# Patient Record
Sex: Female | Born: 1950
Health system: Southern US, Community
[De-identification: ages and names within clinical notes are randomized; demographics above are authoritative.]

## PROBLEM LIST (undated history)

## (undated) DIAGNOSIS — Z923 Personal history of irradiation: Secondary | ICD-10-CM

## (undated) DIAGNOSIS — F32A Depression, unspecified: Secondary | ICD-10-CM

## (undated) DIAGNOSIS — E7800 Pure hypercholesterolemia, unspecified: Secondary | ICD-10-CM

## (undated) DIAGNOSIS — Z8042 Family history of malignant neoplasm of prostate: Secondary | ICD-10-CM

## (undated) DIAGNOSIS — Z803 Family history of malignant neoplasm of breast: Secondary | ICD-10-CM

## (undated) DIAGNOSIS — K219 Gastro-esophageal reflux disease without esophagitis: Secondary | ICD-10-CM

## (undated) DIAGNOSIS — C50919 Malignant neoplasm of unspecified site of unspecified female breast: Secondary | ICD-10-CM

## (undated) DIAGNOSIS — R7303 Prediabetes: Secondary | ICD-10-CM

## (undated) DIAGNOSIS — F419 Anxiety disorder, unspecified: Secondary | ICD-10-CM

## (undated) HISTORY — DX: Family history of malignant neoplasm of prostate: Z80.42

## (undated) HISTORY — PX: BREAST LUMPECTOMY: SHX2

## (undated) HISTORY — DX: Family history of malignant neoplasm of breast: Z80.3

---

## 1997-04-01 DIAGNOSIS — Z923 Personal history of irradiation: Secondary | ICD-10-CM

## 1997-04-01 HISTORY — DX: Personal history of irradiation: Z92.3

## 1997-04-01 HISTORY — PX: BREAST LUMPECTOMY: SHX2

## 1997-07-28 ENCOUNTER — Other Ambulatory Visit: Admission: RE | Admit: 1997-07-28 | Discharge: 1997-07-28 | Payer: Self-pay | Admitting: Obstetrics and Gynecology

## 1998-01-23 ENCOUNTER — Ambulatory Visit (HOSPITAL_COMMUNITY): Admission: RE | Admit: 1998-01-23 | Discharge: 1998-01-23 | Payer: Self-pay | Admitting: Obstetrics and Gynecology

## 1998-01-23 ENCOUNTER — Encounter: Payer: Self-pay | Admitting: Obstetrics and Gynecology

## 1998-01-24 ENCOUNTER — Encounter: Payer: Self-pay | Admitting: Obstetrics and Gynecology

## 1998-01-24 ENCOUNTER — Ambulatory Visit (HOSPITAL_COMMUNITY): Admission: RE | Admit: 1998-01-24 | Discharge: 1998-01-24 | Payer: Self-pay | Admitting: Obstetrics and Gynecology

## 1998-01-27 ENCOUNTER — Encounter: Admission: RE | Admit: 1998-01-27 | Discharge: 1998-04-27 | Payer: Self-pay | Admitting: *Deleted

## 1998-02-03 ENCOUNTER — Ambulatory Visit (HOSPITAL_BASED_OUTPATIENT_CLINIC_OR_DEPARTMENT_OTHER): Admission: RE | Admit: 1998-02-03 | Discharge: 1998-02-03 | Payer: Self-pay | Admitting: Surgery

## 1998-02-03 HISTORY — PX: BREAST EXCISIONAL BIOPSY: SUR124

## 1998-08-02 ENCOUNTER — Other Ambulatory Visit: Admission: RE | Admit: 1998-08-02 | Discharge: 1998-08-02 | Payer: Self-pay | Admitting: Obstetrics and Gynecology

## 1998-11-15 ENCOUNTER — Ambulatory Visit (HOSPITAL_COMMUNITY): Admission: RE | Admit: 1998-11-15 | Discharge: 1998-11-15 | Payer: Self-pay | Admitting: Obstetrics and Gynecology

## 1998-11-15 ENCOUNTER — Encounter: Payer: Self-pay | Admitting: Obstetrics and Gynecology

## 1999-05-16 ENCOUNTER — Encounter: Payer: Self-pay | Admitting: Surgery

## 1999-05-16 ENCOUNTER — Ambulatory Visit (HOSPITAL_COMMUNITY): Admission: RE | Admit: 1999-05-16 | Discharge: 1999-05-16 | Payer: Self-pay | Admitting: Surgery

## 1999-08-15 ENCOUNTER — Other Ambulatory Visit: Admission: RE | Admit: 1999-08-15 | Discharge: 1999-08-15 | Payer: Self-pay | Admitting: Obstetrics and Gynecology

## 1999-11-16 ENCOUNTER — Encounter: Admission: RE | Admit: 1999-11-16 | Discharge: 1999-11-16 | Payer: Self-pay | Admitting: Surgery

## 1999-11-16 ENCOUNTER — Encounter: Payer: Self-pay | Admitting: Surgery

## 2000-08-18 ENCOUNTER — Other Ambulatory Visit: Admission: RE | Admit: 2000-08-18 | Discharge: 2000-08-18 | Payer: Self-pay | Admitting: Obstetrics and Gynecology

## 2000-12-08 ENCOUNTER — Encounter: Admission: RE | Admit: 2000-12-08 | Discharge: 2000-12-08 | Payer: Self-pay | Admitting: Surgery

## 2000-12-08 ENCOUNTER — Encounter: Payer: Self-pay | Admitting: Surgery

## 2001-12-18 ENCOUNTER — Encounter: Admission: RE | Admit: 2001-12-18 | Discharge: 2001-12-18 | Payer: Self-pay | Admitting: Surgery

## 2001-12-18 ENCOUNTER — Encounter: Payer: Self-pay | Admitting: Surgery

## 2002-02-22 ENCOUNTER — Emergency Department (HOSPITAL_COMMUNITY): Admission: EM | Admit: 2002-02-22 | Discharge: 2002-02-23 | Payer: Self-pay | Admitting: Emergency Medicine

## 2002-04-01 HISTORY — PX: COLONOSCOPY: SHX174

## 2002-12-24 ENCOUNTER — Encounter: Payer: Self-pay | Admitting: Surgery

## 2002-12-24 ENCOUNTER — Encounter: Admission: RE | Admit: 2002-12-24 | Discharge: 2002-12-24 | Payer: Self-pay | Admitting: Surgery

## 2003-12-26 ENCOUNTER — Encounter: Admission: RE | Admit: 2003-12-26 | Discharge: 2003-12-26 | Payer: Self-pay | Admitting: Surgery

## 2005-03-01 ENCOUNTER — Encounter: Admission: RE | Admit: 2005-03-01 | Discharge: 2005-03-01 | Payer: Self-pay | Admitting: Surgery

## 2005-03-18 ENCOUNTER — Encounter: Admission: RE | Admit: 2005-03-18 | Discharge: 2005-03-18 | Payer: Self-pay | Admitting: Surgery

## 2005-07-31 ENCOUNTER — Encounter: Payer: Self-pay | Admitting: Surgery

## 2005-12-30 ENCOUNTER — Encounter: Admission: RE | Admit: 2005-12-30 | Discharge: 2005-12-30 | Payer: Self-pay | Admitting: Surgery

## 2007-02-23 ENCOUNTER — Encounter: Admission: RE | Admit: 2007-02-23 | Discharge: 2007-02-23 | Payer: Self-pay | Admitting: Surgery

## 2008-04-22 ENCOUNTER — Emergency Department (HOSPITAL_COMMUNITY): Admission: EM | Admit: 2008-04-22 | Discharge: 2008-04-22 | Payer: Self-pay | Admitting: Emergency Medicine

## 2008-05-01 ENCOUNTER — Emergency Department (HOSPITAL_COMMUNITY): Admission: EM | Admit: 2008-05-01 | Discharge: 2008-05-01 | Payer: Self-pay | Admitting: Family Medicine

## 2008-07-04 ENCOUNTER — Encounter: Admission: RE | Admit: 2008-07-04 | Discharge: 2008-07-04 | Payer: Self-pay | Admitting: Surgery

## 2010-01-22 ENCOUNTER — Encounter: Admission: RE | Admit: 2010-01-22 | Discharge: 2010-01-22 | Payer: Self-pay | Admitting: Surgery

## 2010-04-21 ENCOUNTER — Encounter: Payer: Self-pay | Admitting: Surgery

## 2011-01-13 ENCOUNTER — Encounter: Payer: Self-pay | Admitting: *Deleted

## 2011-01-13 ENCOUNTER — Emergency Department (HOSPITAL_BASED_OUTPATIENT_CLINIC_OR_DEPARTMENT_OTHER)
Admission: EM | Admit: 2011-01-13 | Discharge: 2011-01-13 | Disposition: A | Discharge status: Home / Self Care | Payer: BC Managed Care – PPO | Attending: Emergency Medicine | Admitting: Emergency Medicine

## 2011-01-13 ENCOUNTER — Emergency Department (INDEPENDENT_AMBULATORY_CARE_PROVIDER_SITE_OTHER): Payer: BC Managed Care – PPO

## 2011-01-13 DIAGNOSIS — E7800 Pure hypercholesterolemia, unspecified: Secondary | ICD-10-CM | POA: Insufficient documentation

## 2011-01-13 DIAGNOSIS — X500XXA Overexertion from strenuous movement or load, initial encounter: Secondary | ICD-10-CM

## 2011-01-13 DIAGNOSIS — IMO0002 Reserved for concepts with insufficient information to code with codable children: Secondary | ICD-10-CM

## 2011-01-13 DIAGNOSIS — Z853 Personal history of malignant neoplasm of breast: Secondary | ICD-10-CM | POA: Insufficient documentation

## 2011-01-13 DIAGNOSIS — S93609A Unspecified sprain of unspecified foot, initial encounter: Secondary | ICD-10-CM | POA: Insufficient documentation

## 2011-01-13 DIAGNOSIS — M79609 Pain in unspecified limb: Secondary | ICD-10-CM

## 2011-01-13 DIAGNOSIS — M7730 Calcaneal spur, unspecified foot: Secondary | ICD-10-CM

## 2011-01-13 DIAGNOSIS — W1809XA Striking against other object with subsequent fall, initial encounter: Secondary | ICD-10-CM | POA: Insufficient documentation

## 2011-01-13 HISTORY — DX: Anxiety disorder, unspecified: F41.9

## 2011-01-13 HISTORY — DX: Malignant neoplasm of unspecified site of unspecified female breast: C50.919

## 2011-01-13 HISTORY — DX: Pure hypercholesterolemia, unspecified: E78.00

## 2011-01-13 MED ORDER — HYDROCODONE-ACETAMINOPHEN 5-325 MG PO TABS: 1.0000 | ORAL_TABLET | Freq: Four times a day (QID) | ORAL | Status: AC | PRN

## 2011-01-13 NOTE — ED Notes (Signed)
Pt states she bent her toes back somehow and is now c/o pain to her left foot. Tried to lie down tonight and it was throbbing. Has tried ice without relief.

## 2011-01-13 NOTE — ED Provider Notes (Addendum)
History     CSN: [id number] Arrival date & time: 01/13/2011 12:41 AM  Chief Complaint  Patient presents with  . Foot Injury    (Consider location/radiation/quality/duration/timing/severity/associated sxs/prior treatment) HPI This is a 60 year old white female who tripped over a baby stroller yesterday afternoon about 5 PM. In doing so she hyperextended her left great toe. She is now having moderate pain and tenderness at the base of the left great toe. There is also pain across the plantar forefoot. Pain is exacerbated by ambulation or extension of the left great toe. She denies other injury. She took 800 mg of ibuprofen about 10 PM with some relief.  Past Medical History  Diagnosis Date  . Breast cancer   . Hypercholesteremia   . Anxiety     Past Surgical History  Procedure Date  . Breast lumpectomy     History reviewed. No pertinent family history.  History  Substance Use Topics  . Smoking status: Never Smoker   . Smokeless tobacco: Not on file  . Alcohol Use: Yes    OB History    Grav Para Term Preterm Abortions TAB SAB Ect Mult Living                  Review of Systems  All other systems reviewed and are negative.    Allergies  Sulfa antibiotics  Home Medications   Current Outpatient Rx  Name Route Sig Dispense Refill  . ESCITALOPRAM OXALATE 20 MG PO TABS Oral Take 20 mg by mouth daily.      Marland Kitchen SIMVASTATIN 40 MG PO TABS Oral Take 40 mg by mouth at bedtime.        BP 143/83  Pulse 86  Temp(Src) 98.3 F (36.8 C) (Oral)  Resp 20  Ht 5\' 8"  (1.727 m)  Wt 205 lb (92.987 kg)  BMI 31.17 kg/m2  SpO2 96%  Physical Exam General: Well-developed, well-nourished female in no acute distress; appearance consistent with age of record HENT: normocephalic, atraumatic Eyes: Normal appearance Neck: supple Heart: regular rate and rhythm Lungs: Normal respiratory effort and excursion Abdomen: soft; nondistended Extremities: pulses normal; no deformity;  tenderness at base of left great toe with pain on extension of the left great toe; bunions present bilaterally Neurologic: Awake, alert and oriented;motor function intact in all extremities and symmetric; no facial droop Skin: Warm and dry Psychiatric: Normal mood and affect    ED Course  Procedures (including critical care time)   MDM  Nursing notes and vitals signs, including pulse oximetry, reviewed.  Summary of this visit's results, reviewed by myself:  Labs:  No results found for this or any previous visit.  Imaging Studies: Dg Foot Complete Left  01/13/2011  *RADIOLOGY REPORT*  Clinical Data: Left foot pain after hyperextension injury.  LEFT FOOT - COMPLETE 3+ VIEW  Comparison: None.  Findings: Degenerative changes in the first metatarsal phalangeal joint.  Small plantar calcaneal spur.  Focal bone spur along the proximal second metatarsal.  No acute fracture or subluxations suggested.  IMPRESSION: Degenerative changes.  No acute bony abnormalities demonstrated.  Original Report Authenticated By: Marlon Pel, M.D.            Hanley Seamen, MD 01/13/11 0302  Hanley Seamen, MD 01/13/11 [phone]

## 2011-05-06 ENCOUNTER — Other Ambulatory Visit: Payer: Self-pay | Admitting: Internal Medicine

## 2011-05-06 DIAGNOSIS — Z1231 Encounter for screening mammogram for malignant neoplasm of breast: Secondary | ICD-10-CM

## 2011-05-20 ENCOUNTER — Ambulatory Visit
Admission: RE | Admit: 2011-05-20 | Discharge: 2011-05-20 | Disposition: A | Discharge status: Home / Self Care | Payer: BC Managed Care – PPO | Source: Ambulatory Visit | Attending: Internal Medicine | Admitting: Internal Medicine

## 2011-05-20 DIAGNOSIS — Z1231 Encounter for screening mammogram for malignant neoplasm of breast: Secondary | ICD-10-CM

## 2012-04-09 ENCOUNTER — Encounter: Payer: Self-pay | Admitting: Gastroenterology

## 2012-05-11 ENCOUNTER — Other Ambulatory Visit (HOSPITAL_COMMUNITY)
Admission: RE | Admit: 2012-05-11 | Discharge: 2012-05-11 | Disposition: A | Discharge status: Home / Self Care | Payer: BC Managed Care – PPO | Source: Ambulatory Visit | Attending: Internal Medicine | Admitting: Internal Medicine

## 2012-05-11 DIAGNOSIS — Z01419 Encounter for gynecological examination (general) (routine) without abnormal findings: Secondary | ICD-10-CM | POA: Insufficient documentation

## 2013-01-15 ENCOUNTER — Encounter: Payer: Self-pay | Admitting: Gastroenterology

## 2013-05-05 ENCOUNTER — Other Ambulatory Visit: Payer: Self-pay

## 2013-05-05 DIAGNOSIS — Z9889 Other specified postprocedural states: Secondary | ICD-10-CM

## 2013-05-05 DIAGNOSIS — Z1231 Encounter for screening mammogram for malignant neoplasm of breast: Secondary | ICD-10-CM

## 2013-05-26 ENCOUNTER — Ambulatory Visit
Admission: RE | Admit: 2013-05-26 | Discharge: 2013-05-26 | Disposition: A | Discharge status: Home / Self Care | Payer: Self-pay | Source: Ambulatory Visit

## 2013-05-26 DIAGNOSIS — Z1231 Encounter for screening mammogram for malignant neoplasm of breast: Secondary | ICD-10-CM

## 2013-05-26 DIAGNOSIS — Z9889 Other specified postprocedural states: Secondary | ICD-10-CM

## 2013-10-29 ENCOUNTER — Encounter: Payer: Self-pay | Admitting: Gastroenterology

## 2014-11-17 ENCOUNTER — Other Ambulatory Visit: Payer: Self-pay

## 2014-11-17 DIAGNOSIS — Z1231 Encounter for screening mammogram for malignant neoplasm of breast: Secondary | ICD-10-CM

## 2014-11-28 ENCOUNTER — Ambulatory Visit
Admission: RE | Admit: 2014-11-28 | Discharge: 2014-11-28 | Disposition: A | Discharge status: Home / Self Care | Payer: BLUE CROSS/BLUE SHIELD | Source: Ambulatory Visit

## 2014-11-28 DIAGNOSIS — Z1231 Encounter for screening mammogram for malignant neoplasm of breast: Secondary | ICD-10-CM

## 2015-07-03 DIAGNOSIS — R7303 Prediabetes: Secondary | ICD-10-CM | POA: Diagnosis not present

## 2015-07-03 DIAGNOSIS — E782 Mixed hyperlipidemia: Secondary | ICD-10-CM | POA: Diagnosis not present

## 2015-07-03 DIAGNOSIS — R030 Elevated blood-pressure reading, without diagnosis of hypertension: Secondary | ICD-10-CM | POA: Diagnosis not present

## 2015-07-03 DIAGNOSIS — Z0000 Encounter for general adult medical examination without abnormal findings: Secondary | ICD-10-CM | POA: Diagnosis not present

## 2015-07-24 DIAGNOSIS — H04123 Dry eye syndrome of bilateral lacrimal glands: Secondary | ICD-10-CM | POA: Diagnosis not present

## 2015-07-24 DIAGNOSIS — D3131 Benign neoplasm of right choroid: Secondary | ICD-10-CM | POA: Diagnosis not present

## 2015-08-07 ENCOUNTER — Other Ambulatory Visit (HOSPITAL_COMMUNITY)
Admission: RE | Admit: 2015-08-07 | Discharge: 2015-08-07 | Disposition: A | Discharge status: Home / Self Care | Payer: PPO | Source: Ambulatory Visit | Attending: Internal Medicine | Admitting: Internal Medicine

## 2015-08-07 ENCOUNTER — Other Ambulatory Visit: Payer: Self-pay | Admitting: Registered Nurse

## 2015-08-07 DIAGNOSIS — Z780 Asymptomatic menopausal state: Secondary | ICD-10-CM | POA: Diagnosis not present

## 2015-08-07 DIAGNOSIS — Z124 Encounter for screening for malignant neoplasm of cervix: Secondary | ICD-10-CM | POA: Insufficient documentation

## 2015-08-07 DIAGNOSIS — Z01419 Encounter for gynecological examination (general) (routine) without abnormal findings: Secondary | ICD-10-CM | POA: Diagnosis not present

## 2015-08-07 DIAGNOSIS — Z1212 Encounter for screening for malignant neoplasm of rectum: Secondary | ICD-10-CM | POA: Diagnosis not present

## 2015-08-07 DIAGNOSIS — Z853 Personal history of malignant neoplasm of breast: Secondary | ICD-10-CM | POA: Diagnosis not present

## 2015-08-10 LAB — CYTOLOGY - PAP

## 2015-12-06 DIAGNOSIS — Z808 Family history of malignant neoplasm of other organs or systems: Secondary | ICD-10-CM | POA: Diagnosis not present

## 2015-12-06 DIAGNOSIS — Z85828 Personal history of other malignant neoplasm of skin: Secondary | ICD-10-CM | POA: Diagnosis not present

## 2015-12-06 DIAGNOSIS — L570 Actinic keratosis: Secondary | ICD-10-CM | POA: Diagnosis not present

## 2015-12-06 DIAGNOSIS — L821 Other seborrheic keratosis: Secondary | ICD-10-CM | POA: Diagnosis not present

## 2016-04-26 DIAGNOSIS — A084 Viral intestinal infection, unspecified: Secondary | ICD-10-CM | POA: Diagnosis not present

## 2016-07-22 DIAGNOSIS — Z131 Encounter for screening for diabetes mellitus: Secondary | ICD-10-CM | POA: Diagnosis not present

## 2016-07-22 DIAGNOSIS — I479 Paroxysmal tachycardia, unspecified: Secondary | ICD-10-CM | POA: Diagnosis not present

## 2016-07-22 DIAGNOSIS — E782 Mixed hyperlipidemia: Secondary | ICD-10-CM | POA: Diagnosis not present

## 2016-07-22 DIAGNOSIS — R7303 Prediabetes: Secondary | ICD-10-CM | POA: Diagnosis not present

## 2016-07-29 ENCOUNTER — Encounter: Payer: Self-pay | Admitting: Gastroenterology

## 2016-07-29 ENCOUNTER — Other Ambulatory Visit: Payer: Self-pay | Admitting: Internal Medicine

## 2016-07-29 DIAGNOSIS — E782 Mixed hyperlipidemia: Secondary | ICD-10-CM | POA: Diagnosis not present

## 2016-07-29 DIAGNOSIS — Z0000 Encounter for general adult medical examination without abnormal findings: Secondary | ICD-10-CM | POA: Diagnosis not present

## 2016-07-29 DIAGNOSIS — R1033 Periumbilical pain: Secondary | ICD-10-CM | POA: Diagnosis not present

## 2016-07-29 DIAGNOSIS — Z780 Asymptomatic menopausal state: Secondary | ICD-10-CM | POA: Diagnosis not present

## 2016-07-29 DIAGNOSIS — Z1231 Encounter for screening mammogram for malignant neoplasm of breast: Secondary | ICD-10-CM

## 2016-07-29 DIAGNOSIS — I479 Paroxysmal tachycardia, unspecified: Secondary | ICD-10-CM | POA: Diagnosis not present

## 2016-07-29 DIAGNOSIS — R030 Elevated blood-pressure reading, without diagnosis of hypertension: Secondary | ICD-10-CM | POA: Diagnosis not present

## 2016-07-29 DIAGNOSIS — R7303 Prediabetes: Secondary | ICD-10-CM | POA: Diagnosis not present

## 2016-07-30 DIAGNOSIS — H04123 Dry eye syndrome of bilateral lacrimal glands: Secondary | ICD-10-CM | POA: Diagnosis not present

## 2016-07-30 DIAGNOSIS — D3131 Benign neoplasm of right choroid: Secondary | ICD-10-CM | POA: Diagnosis not present

## 2016-08-12 DIAGNOSIS — Z01419 Encounter for gynecological examination (general) (routine) without abnormal findings: Secondary | ICD-10-CM | POA: Diagnosis not present

## 2016-08-20 ENCOUNTER — Ambulatory Visit: Payer: PPO

## 2016-09-04 ENCOUNTER — Ambulatory Visit
Admission: RE | Admit: 2016-09-04 | Discharge: 2016-09-04 | Disposition: A | Discharge status: Home / Self Care | Payer: PPO | Source: Ambulatory Visit | Attending: Internal Medicine | Admitting: Internal Medicine

## 2016-09-04 DIAGNOSIS — Z1231 Encounter for screening mammogram for malignant neoplasm of breast: Secondary | ICD-10-CM | POA: Diagnosis not present

## 2016-09-04 HISTORY — DX: Personal history of irradiation: Z92.3

## 2016-09-05 ENCOUNTER — Ambulatory Visit: Payer: PPO

## 2016-09-10 ENCOUNTER — Encounter: Payer: Self-pay | Admitting: Gastroenterology

## 2016-09-10 ENCOUNTER — Ambulatory Visit: Payer: PPO | Admitting: *Deleted

## 2016-09-10 VITALS — Ht 68.0 in | Wt 216.0 lb

## 2016-09-10 DIAGNOSIS — Z1211 Encounter for screening for malignant neoplasm of colon: Secondary | ICD-10-CM

## 2016-09-10 MED ORDER — NA SULFATE-K SULFATE-MG SULF 17.5-3.13-1.6 GM/177ML PO SOLN: 1.0000 | Freq: Once | ORAL | 0 refills | Status: AC

## 2016-09-10 NOTE — Progress Notes (Signed)
Denies allergies to eggs or soy products. Denies complications with sedation or anesthesia. Denies O2 use. Denies use of diet or weight loss medications.  Emmi instructions given for colonoscopy.  

## 2016-09-24 ENCOUNTER — Encounter: Payer: Self-pay | Admitting: Gastroenterology

## 2016-09-24 ENCOUNTER — Ambulatory Visit (AMBULATORY_SURGERY_CENTER): Payer: PPO | Admitting: Gastroenterology

## 2016-09-24 VITALS — BP 131/64 | HR 72 | Temp 98.4°F | Resp 14 | Ht 68.0 in | Wt 216.0 lb

## 2016-09-24 DIAGNOSIS — Z1212 Encounter for screening for malignant neoplasm of rectum: Secondary | ICD-10-CM | POA: Diagnosis not present

## 2016-09-24 DIAGNOSIS — Z1211 Encounter for screening for malignant neoplasm of colon: Secondary | ICD-10-CM | POA: Diagnosis not present

## 2016-09-24 DIAGNOSIS — D123 Benign neoplasm of transverse colon: Secondary | ICD-10-CM

## 2016-09-24 DIAGNOSIS — K635 Polyp of colon: Secondary | ICD-10-CM

## 2016-09-24 MED ORDER — SODIUM CHLORIDE 0.9 % IV SOLN: 500.0000 mL | INTRAVENOUS | Status: DC

## 2016-09-24 NOTE — Patient Instructions (Signed)
YOU HAD AN ENDOSCOPIC PROCEDURE TODAY AT [facility]:   Refer to the procedure report that was given to you for any specific questions about what was found during the examination.  If the procedure report does not answer your questions, please call your gastroenterologist to clarify.  If you requested that your care partner not be given the details of your procedure findings, then the procedure report has been included in a sealed envelope for you to review at your convenience later.  YOU SHOULD EXPECT: Some feelings of bloating in the abdomen. Passage of more gas than usual.  Walking can help get rid of the air that was put into your GI tract during the procedure and reduce the bloating. If you had a lower endoscopy (such as a colonoscopy or flexible sigmoidoscopy) you may notice spotting of blood in your stool or on the toilet paper. If you underwent a bowel prep for your procedure, you may not have a normal bowel movement for a few days.  Please Note:  You might notice some irritation and congestion in your nose or some drainage.  This is from the oxygen used during your procedure.  There is no need for concern and it should clear up in a day or so.  SYMPTOMS TO REPORT IMMEDIATELY:   Following lower endoscopy (colonoscopy or flexible sigmoidoscopy):  Excessive amounts of blood in the stool  Significant tenderness or worsening of abdominal pains  Swelling of the abdomen that is new, acute  Fever of 100F or higher    For urgent or emergent issues, a gastroenterologist can be reached at any hour by calling [phone].   DIET:  We do recommend a small meal at first, but then you may proceed to your regular diet.  Drink plenty of fluids but you should avoid alcoholic beverages for 24 hours.  ACTIVITY:  You should plan to take it easy for the rest of today and you should NOT DRIVE or use heavy machinery until tomorrow (because of the sedation medicines used during the test).     FOLLOW UP: Our staff will call the number listed on your records the next business day following your procedure to check on you and address any questions or concerns that you may have regarding the information given to you following your procedure. If we do not reach you, we will leave a message.  However, if you are feeling well and you are not experiencing any problems, there is no need to return our call.  We will assume that you have returned to your regular daily activities without incident.  If any biopsies were taken you will be contacted by phone or by letter within the next 1-3 weeks.  Please call us at [phone] if you have not heard about the biopsies in 3 weeks.    SIGNATURES/CONFIDENTIALITY: You and/or your care partner have signed paperwork which will be entered into your electronic medical record.  These signatures attest to the fact that that the information above on your After Visit Summary has been reviewed and is understood.  Full responsibility of the confidentiality of this discharge information lies with you and/or your care-partner.    INFORMATION ON POLYPS,DIVERTICULOSIS,& HEMORRHOIDS GIVEN TO YOU TODAY  AWAIT PATHOLOGY RESULTS

## 2016-09-24 NOTE — Op Note (Signed)
[facility] Patient Name: Gina Walsh Procedure Date: 09/24/2016 10:40 AM MRN: [id number] Endoscopist: Mauri Pole , MD Age: 66 Referring MD:  Date of Birth: Nov 22, 1950 Gender: Female Account #: [id number] Procedure:                Colonoscopy Indications:              Screening for colorectal malignant neoplasm Medicines:                Monitored Anesthesia Care Procedure:                Pre-Anesthesia Assessment:                           - Prior to the procedure, a History and Physical                            was performed, and patient medications and                            allergies were reviewed. The patient's tolerance of                            previous anesthesia was also reviewed. The risks                            and benefits of the procedure and the sedation                            options and risks were discussed with the patient.                            All questions were answered, and informed consent                            was obtained. Prior Anticoagulants: The patient has                            taken no previous anticoagulant or antiplatelet                            agents. ASA Grade Assessment: II - A patient with                            mild systemic disease. After reviewing the risks                            and benefits, the patient was deemed in                            satisfactory condition to undergo the procedure.                           After obtaining informed consent, the colonoscope  was passed under direct vision. Throughout the                            procedure, the patient's blood pressure, pulse, and                            oxygen saturations were monitored continuously. The                            Model PCF-[id number]) scope was introduced                            through the anus and advanced to the the terminal                            ileum,  with identification of the appendiceal                            orifice and IC valve. The colonoscopy was performed                            without difficulty. The patient tolerated the                            procedure well. The quality of the bowel                            preparation was excellent. The terminal ileum,                            ileocecal valve, appendiceal orifice, and rectum                            were photographed. Scope In: 10:43:57 AM Scope Out: 11:00:36 AM Scope Withdrawal Time: 0 hours 8 minutes 21 seconds  Total Procedure Duration: 0 hours 16 minutes 39 seconds  Findings:                 The perianal and digital rectal examinations were                            normal.                           A 3 mm polyp was found in the transverse colon. The                            polyp was sessile. The polyp was removed with a                            cold biopsy forceps. Resection and retrieval were                            complete.  Multiple small and large-mouthed diverticula were                            found in the sigmoid colon and descending colon.                            There was narrowing of the colon in association                            with the diverticular opening. There was evidence                            of diverticular spasm. Peri-diverticular erythema                            was seen.                           Non-bleeding internal hemorrhoids were found during                            retroflexion. The hemorrhoids were small.                           The exam was otherwise without abnormality. Complications:            No immediate complications. Estimated Blood Loss:     Estimated blood loss was minimal. Impression:               - One 3 mm polyp in the transverse colon, removed                            with a cold biopsy forceps. Resected and retrieved.                           -  Severe diverticulosis in the sigmoid colon and in                            the descending colon. There was narrowing of the                            colon in association with the diverticular opening.                            There was evidence of diverticular spasm.                            Peri-diverticular erythema was seen.                           - Non-bleeding internal hemorrhoids.                           - The examination was otherwise normal. Recommendation:           - Patient has a  contact number available for                            emergencies. The signs and symptoms of potential                            delayed complications were discussed with the                            patient. Return to normal activities tomorrow.                            Written discharge instructions were provided to the                            patient.                           - Resume previous diet.                           - Continue present medications.                           - Await pathology results.                           - Repeat colonoscopy in 5-10 years for surveillance                            based on pathology results. Mauri Pole, MD 09/24/2016 11:04:35 AM This report has been signed electronically.

## 2016-09-24 NOTE — Progress Notes (Signed)
Called to room to assist during endoscopic procedure.  Patient ID and intended procedure confirmed with present staff. Received instructions for my participation in the procedure from the performing physician.  

## 2016-09-24 NOTE — Progress Notes (Signed)
Spontaneous respirations throughout. VSS. Small amount of clear emesis mid-procedure without furthur event -- oropharynx suctioned. Dr Silverio Decamp aware. Resting comfortably. To PACU on room air. Report to  [facility]

## 2016-09-25 ENCOUNTER — Telehealth: Payer: Self-pay

## 2016-09-25 NOTE — Telephone Encounter (Signed)
  Follow up Call-  Call back number 09/24/2016  Post procedure Call Back phone  # [phone]  Permission to leave phone message Yes  Some recent data might be hidden     Patient questions:  Do you have a fever, pain , or abdominal swelling? No. Pain Score  0 *  Have you tolerated food without any problems? Yes.    Have you been able to return to your normal activities? Yes.    Do you have any questions about your discharge instructions: Diet   No. Medications  No. Follow up visit  No.  Do you have questions or concerns about your Care? No.  Actions: * If pain score is 4 or above: No action needed, pain <4.

## 2016-10-08 ENCOUNTER — Encounter: Payer: Self-pay | Admitting: Gastroenterology

## 2016-12-11 DIAGNOSIS — L570 Actinic keratosis: Secondary | ICD-10-CM | POA: Diagnosis not present

## 2016-12-11 DIAGNOSIS — Z808 Family history of malignant neoplasm of other organs or systems: Secondary | ICD-10-CM | POA: Diagnosis not present

## 2016-12-11 DIAGNOSIS — L821 Other seborrheic keratosis: Secondary | ICD-10-CM | POA: Diagnosis not present

## 2016-12-11 DIAGNOSIS — Z23 Encounter for immunization: Secondary | ICD-10-CM | POA: Diagnosis not present

## 2016-12-11 DIAGNOSIS — Z85828 Personal history of other malignant neoplasm of skin: Secondary | ICD-10-CM | POA: Diagnosis not present

## 2016-12-11 DIAGNOSIS — B079 Viral wart, unspecified: Secondary | ICD-10-CM | POA: Diagnosis not present

## 2016-12-11 DIAGNOSIS — D2372 Other benign neoplasm of skin of left lower limb, including hip: Secondary | ICD-10-CM | POA: Diagnosis not present

## 2017-05-13 DIAGNOSIS — Z411 Encounter for cosmetic surgery: Secondary | ICD-10-CM | POA: Diagnosis not present

## 2017-05-13 DIAGNOSIS — T451X5D Adverse effect of antineoplastic and immunosuppressive drugs, subsequent encounter: Secondary | ICD-10-CM | POA: Diagnosis not present

## 2017-07-15 DIAGNOSIS — T451X5D Adverse effect of antineoplastic and immunosuppressive drugs, subsequent encounter: Secondary | ICD-10-CM | POA: Diagnosis not present

## 2017-07-15 DIAGNOSIS — L570 Actinic keratosis: Secondary | ICD-10-CM | POA: Diagnosis not present

## 2017-08-04 DIAGNOSIS — E782 Mixed hyperlipidemia: Secondary | ICD-10-CM | POA: Diagnosis not present

## 2017-08-04 DIAGNOSIS — I479 Paroxysmal tachycardia, unspecified: Secondary | ICD-10-CM | POA: Diagnosis not present

## 2017-08-04 DIAGNOSIS — R7303 Prediabetes: Secondary | ICD-10-CM | POA: Diagnosis not present

## 2017-08-11 DIAGNOSIS — Z0000 Encounter for general adult medical examination without abnormal findings: Secondary | ICD-10-CM | POA: Diagnosis not present

## 2017-08-11 DIAGNOSIS — R7303 Prediabetes: Secondary | ICD-10-CM | POA: Diagnosis not present

## 2017-08-11 DIAGNOSIS — R030 Elevated blood-pressure reading, without diagnosis of hypertension: Secondary | ICD-10-CM | POA: Diagnosis not present

## 2017-08-11 DIAGNOSIS — G4700 Insomnia, unspecified: Secondary | ICD-10-CM | POA: Diagnosis not present

## 2017-08-11 DIAGNOSIS — E782 Mixed hyperlipidemia: Secondary | ICD-10-CM | POA: Diagnosis not present

## 2017-08-11 DIAGNOSIS — Z23 Encounter for immunization: Secondary | ICD-10-CM | POA: Diagnosis not present

## 2017-08-11 DIAGNOSIS — E6609 Other obesity due to excess calories: Secondary | ICD-10-CM | POA: Diagnosis not present

## 2017-08-11 DIAGNOSIS — I479 Paroxysmal tachycardia, unspecified: Secondary | ICD-10-CM | POA: Diagnosis not present

## 2017-08-18 DIAGNOSIS — Z01419 Encounter for gynecological examination (general) (routine) without abnormal findings: Secondary | ICD-10-CM | POA: Diagnosis not present

## 2017-09-02 DIAGNOSIS — H40013 Open angle with borderline findings, low risk, bilateral: Secondary | ICD-10-CM | POA: Diagnosis not present

## 2017-09-02 DIAGNOSIS — H25813 Combined forms of age-related cataract, bilateral: Secondary | ICD-10-CM | POA: Diagnosis not present

## 2017-09-02 DIAGNOSIS — D3131 Benign neoplasm of right choroid: Secondary | ICD-10-CM | POA: Diagnosis not present

## 2017-09-02 DIAGNOSIS — H43813 Vitreous degeneration, bilateral: Secondary | ICD-10-CM | POA: Diagnosis not present

## 2017-12-15 ENCOUNTER — Other Ambulatory Visit: Payer: Self-pay | Admitting: Internal Medicine

## 2017-12-15 DIAGNOSIS — Z1231 Encounter for screening mammogram for malignant neoplasm of breast: Secondary | ICD-10-CM

## 2018-01-19 ENCOUNTER — Ambulatory Visit: Payer: PPO

## 2018-02-04 ENCOUNTER — Ambulatory Visit
Admission: RE | Admit: 2018-02-04 | Discharge: 2018-02-04 | Disposition: A | Discharge status: Home / Self Care | Payer: PPO | Source: Ambulatory Visit | Attending: Internal Medicine | Admitting: Internal Medicine

## 2018-02-04 DIAGNOSIS — Z23 Encounter for immunization: Secondary | ICD-10-CM | POA: Diagnosis not present

## 2018-02-04 DIAGNOSIS — L814 Other melanin hyperpigmentation: Secondary | ICD-10-CM | POA: Diagnosis not present

## 2018-02-04 DIAGNOSIS — D2372 Other benign neoplasm of skin of left lower limb, including hip: Secondary | ICD-10-CM | POA: Diagnosis not present

## 2018-02-04 DIAGNOSIS — L821 Other seborrheic keratosis: Secondary | ICD-10-CM | POA: Diagnosis not present

## 2018-02-04 DIAGNOSIS — Z85828 Personal history of other malignant neoplasm of skin: Secondary | ICD-10-CM | POA: Diagnosis not present

## 2018-02-04 DIAGNOSIS — Z1231 Encounter for screening mammogram for malignant neoplasm of breast: Secondary | ICD-10-CM | POA: Diagnosis not present

## 2018-02-04 DIAGNOSIS — Z808 Family history of malignant neoplasm of other organs or systems: Secondary | ICD-10-CM | POA: Diagnosis not present

## 2018-09-07 DIAGNOSIS — D3131 Benign neoplasm of right choroid: Secondary | ICD-10-CM | POA: Diagnosis not present

## 2018-09-07 DIAGNOSIS — H2513 Age-related nuclear cataract, bilateral: Secondary | ICD-10-CM | POA: Diagnosis not present

## 2018-09-07 DIAGNOSIS — H40013 Open angle with borderline findings, low risk, bilateral: Secondary | ICD-10-CM | POA: Diagnosis not present

## 2018-09-07 DIAGNOSIS — H43813 Vitreous degeneration, bilateral: Secondary | ICD-10-CM | POA: Diagnosis not present

## 2018-09-23 DIAGNOSIS — I479 Paroxysmal tachycardia, unspecified: Secondary | ICD-10-CM | POA: Diagnosis not present

## 2018-09-23 DIAGNOSIS — R030 Elevated blood-pressure reading, without diagnosis of hypertension: Secondary | ICD-10-CM | POA: Diagnosis not present

## 2018-09-23 DIAGNOSIS — E782 Mixed hyperlipidemia: Secondary | ICD-10-CM | POA: Diagnosis not present

## 2018-10-01 DIAGNOSIS — E6609 Other obesity due to excess calories: Secondary | ICD-10-CM | POA: Diagnosis not present

## 2018-10-01 DIAGNOSIS — E782 Mixed hyperlipidemia: Secondary | ICD-10-CM | POA: Diagnosis not present

## 2018-10-01 DIAGNOSIS — Z0000 Encounter for general adult medical examination without abnormal findings: Secondary | ICD-10-CM | POA: Diagnosis not present

## 2018-10-01 DIAGNOSIS — R030 Elevated blood-pressure reading, without diagnosis of hypertension: Secondary | ICD-10-CM | POA: Diagnosis not present

## 2018-10-01 DIAGNOSIS — R7303 Prediabetes: Secondary | ICD-10-CM | POA: Diagnosis not present

## 2018-10-01 DIAGNOSIS — I479 Paroxysmal tachycardia, unspecified: Secondary | ICD-10-CM | POA: Diagnosis not present

## 2018-10-01 DIAGNOSIS — Z7189 Other specified counseling: Secondary | ICD-10-CM | POA: Diagnosis not present

## 2018-10-30 ENCOUNTER — Other Ambulatory Visit: Payer: Self-pay

## 2019-05-03 ENCOUNTER — Ambulatory Visit: Payer: PPO

## 2019-05-04 DIAGNOSIS — L814 Other melanin hyperpigmentation: Secondary | ICD-10-CM | POA: Diagnosis not present

## 2019-05-04 DIAGNOSIS — Z85828 Personal history of other malignant neoplasm of skin: Secondary | ICD-10-CM | POA: Diagnosis not present

## 2019-05-04 DIAGNOSIS — L578 Other skin changes due to chronic exposure to nonionizing radiation: Secondary | ICD-10-CM | POA: Diagnosis not present

## 2019-05-04 DIAGNOSIS — Z23 Encounter for immunization: Secondary | ICD-10-CM | POA: Diagnosis not present

## 2019-05-04 DIAGNOSIS — Z808 Family history of malignant neoplasm of other organs or systems: Secondary | ICD-10-CM | POA: Diagnosis not present

## 2019-05-04 DIAGNOSIS — D2372 Other benign neoplasm of skin of left lower limb, including hip: Secondary | ICD-10-CM | POA: Diagnosis not present

## 2019-05-04 DIAGNOSIS — L821 Other seborrheic keratosis: Secondary | ICD-10-CM | POA: Diagnosis not present

## 2019-05-04 DIAGNOSIS — Z411 Encounter for cosmetic surgery: Secondary | ICD-10-CM | POA: Diagnosis not present

## 2019-05-04 DIAGNOSIS — L570 Actinic keratosis: Secondary | ICD-10-CM | POA: Diagnosis not present

## 2019-05-09 ENCOUNTER — Ambulatory Visit: Payer: PPO

## 2019-09-13 DIAGNOSIS — H40013 Open angle with borderline findings, low risk, bilateral: Secondary | ICD-10-CM | POA: Diagnosis not present

## 2019-09-13 DIAGNOSIS — D3131 Benign neoplasm of right choroid: Secondary | ICD-10-CM | POA: Diagnosis not present

## 2019-09-13 DIAGNOSIS — H43813 Vitreous degeneration, bilateral: Secondary | ICD-10-CM | POA: Diagnosis not present

## 2019-09-13 DIAGNOSIS — H2513 Age-related nuclear cataract, bilateral: Secondary | ICD-10-CM | POA: Diagnosis not present

## 2019-11-04 ENCOUNTER — Other Ambulatory Visit: Payer: Self-pay | Admitting: Internal Medicine

## 2019-11-04 DIAGNOSIS — Z1231 Encounter for screening mammogram for malignant neoplasm of breast: Secondary | ICD-10-CM

## 2019-11-08 DIAGNOSIS — Z0000 Encounter for general adult medical examination without abnormal findings: Secondary | ICD-10-CM | POA: Diagnosis not present

## 2019-11-08 DIAGNOSIS — E782 Mixed hyperlipidemia: Secondary | ICD-10-CM | POA: Diagnosis not present

## 2019-11-08 DIAGNOSIS — R030 Elevated blood-pressure reading, without diagnosis of hypertension: Secondary | ICD-10-CM | POA: Diagnosis not present

## 2019-11-15 DIAGNOSIS — E6609 Other obesity due to excess calories: Secondary | ICD-10-CM | POA: Diagnosis not present

## 2019-11-15 DIAGNOSIS — Z0000 Encounter for general adult medical examination without abnormal findings: Secondary | ICD-10-CM | POA: Diagnosis not present

## 2019-11-15 DIAGNOSIS — R7303 Prediabetes: Secondary | ICD-10-CM | POA: Diagnosis not present

## 2019-11-15 DIAGNOSIS — G4700 Insomnia, unspecified: Secondary | ICD-10-CM | POA: Diagnosis not present

## 2019-11-15 DIAGNOSIS — E782 Mixed hyperlipidemia: Secondary | ICD-10-CM | POA: Diagnosis not present

## 2019-11-15 DIAGNOSIS — I479 Paroxysmal tachycardia, unspecified: Secondary | ICD-10-CM | POA: Diagnosis not present

## 2019-11-15 DIAGNOSIS — R030 Elevated blood-pressure reading, without diagnosis of hypertension: Secondary | ICD-10-CM | POA: Diagnosis not present

## 2019-11-16 ENCOUNTER — Ambulatory Visit
Admission: RE | Admit: 2019-11-16 | Discharge: 2019-11-16 | Disposition: A | Discharge status: Home / Self Care | Payer: PPO | Source: Ambulatory Visit | Attending: Internal Medicine | Admitting: Internal Medicine

## 2019-11-16 ENCOUNTER — Other Ambulatory Visit: Payer: Self-pay

## 2019-11-16 DIAGNOSIS — Z1231 Encounter for screening mammogram for malignant neoplasm of breast: Secondary | ICD-10-CM | POA: Diagnosis not present

## 2019-11-17 ENCOUNTER — Other Ambulatory Visit: Payer: Self-pay | Admitting: Internal Medicine

## 2019-11-17 DIAGNOSIS — Z1231 Encounter for screening mammogram for malignant neoplasm of breast: Secondary | ICD-10-CM

## 2020-03-03 DIAGNOSIS — M2022 Hallux rigidus, left foot: Secondary | ICD-10-CM | POA: Diagnosis not present

## 2020-08-04 ENCOUNTER — Other Ambulatory Visit: Payer: Self-pay | Admitting: Internal Medicine

## 2020-08-04 ENCOUNTER — Ambulatory Visit
Admission: RE | Admit: 2020-08-04 | Discharge: 2020-08-04 | Disposition: A | Discharge status: Home / Self Care | Payer: PPO | Source: Ambulatory Visit | Attending: Internal Medicine | Admitting: Internal Medicine

## 2020-08-04 ENCOUNTER — Other Ambulatory Visit: Payer: Self-pay

## 2020-08-04 DIAGNOSIS — R928 Other abnormal and inconclusive findings on diagnostic imaging of breast: Secondary | ICD-10-CM | POA: Diagnosis not present

## 2020-08-04 DIAGNOSIS — N6311 Unspecified lump in the right breast, upper outer quadrant: Secondary | ICD-10-CM

## 2020-08-09 ENCOUNTER — Ambulatory Visit
Admission: RE | Admit: 2020-08-09 | Discharge: 2020-08-09 | Disposition: A | Discharge status: Home / Self Care | Payer: PPO | Source: Ambulatory Visit | Attending: Internal Medicine | Admitting: Internal Medicine

## 2020-08-09 ENCOUNTER — Other Ambulatory Visit: Payer: Self-pay

## 2020-08-09 DIAGNOSIS — N6311 Unspecified lump in the right breast, upper outer quadrant: Secondary | ICD-10-CM

## 2020-08-09 DIAGNOSIS — C50411 Malignant neoplasm of upper-outer quadrant of right female breast: Secondary | ICD-10-CM | POA: Diagnosis not present

## 2020-08-11 ENCOUNTER — Other Ambulatory Visit: Payer: Self-pay | Admitting: Internal Medicine

## 2020-08-11 DIAGNOSIS — R928 Other abnormal and inconclusive findings on diagnostic imaging of breast: Secondary | ICD-10-CM

## 2020-08-14 ENCOUNTER — Other Ambulatory Visit: Payer: Self-pay

## 2020-08-14 ENCOUNTER — Ambulatory Visit: Payer: PPO

## 2020-08-14 ENCOUNTER — Ambulatory Visit
Admission: RE | Admit: 2020-08-14 | Discharge: 2020-08-14 | Disposition: A | Discharge status: Home / Self Care | Payer: PPO | Source: Ambulatory Visit | Attending: Internal Medicine | Admitting: Internal Medicine

## 2020-08-14 DIAGNOSIS — R922 Inconclusive mammogram: Secondary | ICD-10-CM | POA: Diagnosis not present

## 2020-08-14 DIAGNOSIS — R928 Other abnormal and inconclusive findings on diagnostic imaging of breast: Secondary | ICD-10-CM

## 2020-08-17 ENCOUNTER — Ambulatory Visit: Payer: Self-pay | Admitting: General Surgery

## 2020-08-17 DIAGNOSIS — C50411 Malignant neoplasm of upper-outer quadrant of right female breast: Secondary | ICD-10-CM

## 2020-08-17 DIAGNOSIS — Z170 Estrogen receptor positive status [ER+]: Secondary | ICD-10-CM | POA: Diagnosis not present

## 2020-08-18 ENCOUNTER — Telehealth: Payer: Self-pay | Admitting: Hematology and Oncology

## 2020-08-18 NOTE — Telephone Encounter (Signed)
Received referrals from Dr. [name] for [facility] and genetics for a new dx of breast cancer. Gina Walsh has been cld and scheduled to see Dr. Lindi Adie on 5/24 at 345pm and genetics on 5/26 at 8am.

## 2020-08-21 NOTE — Progress Notes (Signed)
[facility] CONSULT NOTE  Patient Care Team: Merrilee Seashore, MD as PCP - General (Internal Medicine)  CHIEF COMPLAINTS/PURPOSE OF CONSULTATION:  Newly diagnosed breast cancer  HISTORY OF PRESENTING ILLNESS:  Gina Walsh 70 y.o. female is here because of recent diagnosis of invasive mammary carcinoma of the right breast. Patient palpated a right breast mass. Diagnostic mammogram and [place] on 08/04/20 showed a 1.9cm mass at the 11:30 position in the right breast and no right axillary adenopathy. Biopsy on 08/09/20 showed invasive mammary carcinoma, grade 2/3, HER-2 negative (1+), ER+ 70%, PR+ 50%, Ki67 15%. She presents to the clinic today for initial evaluation and discussion of treatment options.   I reviewed her records extensively and collaborated the history with the patient.  SUMMARY OF ONCOLOGIC HISTORY: Oncology History  Malignant neoplasm of upper-outer quadrant of right breast in female, estrogen receptor positive ([place])  08/09/2020 Initial Diagnosis   Patient palpated a right breast mass which was 1.9 cm by mammogram at 11:30 position right breast, axilla negative, biopsy revealed grade 2-3 IDC ER 70%, PR 50%, Ki-67 15%, HER2 negative     MEDICAL HISTORY:  Past Medical History:  Diagnosis Date  . Anxiety   . Breast cancer ([place])   . Depression   . GERD (gastroesophageal reflux disease)   . Hypercholesteremia   . Personal history of radiation therapy 1999  . Pre-diabetes     SURGICAL HISTORY: Past Surgical History:  Procedure Laterality Date  . BREAST EXCISIONAL BIOPSY Bilateral 02/03/1998   malignant  . BREAST LUMPECTOMY  1999   left lumpectomy for cancer  . COLONOSCOPY  2004    SOCIAL HISTORY: Social History   Socioeconomic History  . Marital status: Married    Spouse name: Not on file  . Number of children: Not on file  . Years of education: Not on file  . Highest education level: Not on file  Occupational History  . Not on file   Tobacco Use  . Smoking status: Never Smoker  . Smokeless tobacco: Never Used  Substance and Sexual Activity  . Alcohol use: Yes    Alcohol/week: 1.0 standard drink    Types: 1 Glasses of wine per week    Comment: occassion  . Drug use: No  . Sexual activity: Not on file  Other Topics Concern  . Not on file  Social History Narrative  . Not on file   Social Determinants of Health   Financial Resource Strain: Not on file  Food Insecurity: Not on file  Transportation Needs: Not on file  Physical Activity: Not on file  Stress: Not on file  Social Connections: Not on file  Intimate Partner Violence: Not on file    FAMILY HISTORY: Family History  Problem Relation Age of Onset  . Breast cancer Sister   . Colon cancer Neg Hx   . Esophageal cancer Neg Hx   . Rectal cancer Neg Hx   . Stomach cancer Neg Hx     ALLERGIES:  is allergic to sulfa antibiotics.  MEDICATIONS:  Current Outpatient Medications  Medication Sig Dispense Refill  . atorvastatin (LIPITOR) 40 MG tablet Take 40 mg by mouth daily.    . Calcium Carbonate Antacid (TUMS E-X 750 PO) Take 2 tablets by mouth daily.    Marland Kitchen escitalopram (LEXAPRO) 20 MG tablet Take 20 mg by mouth daily.    . traZODone (DESYREL) 50 MG tablet Take 50 mg by mouth at bedtime.     No current facility-administered medications  for this visit.    REVIEW OF SYSTEMS:   Constitutional: Denies fevers, chills or abnormal night sweats Eyes: Denies blurriness of vision, double vision or watery eyes Ears, nose, mouth, throat, and face: Denies mucositis or sore throat Respiratory: Denies cough, dyspnea or wheezes Cardiovascular: Denies palpitation, chest discomfort or lower extremity swelling Gastrointestinal:  Denies nausea, heartburn or change in bowel habits Skin: Denies abnormal skin rashes Lymphatics: Denies new lymphadenopathy or easy bruising Neurological:Denies numbness, tingling or new weaknesses Behavioral/Psych: Mood is stable, no new  changes  Breast: palpable right breast mass All other systems were reviewed with the patient and are negative.  PHYSICAL EXAMINATION: ECOG PERFORMANCE STATUS: 1 - Symptomatic but completely ambulatory  Vitals:   08/22/20 1540  BP: (!) 143/91  Pulse: 91  Resp: 18  Temp: (!) 97.5 F (36.4 C)  SpO2: 95%   Filed Weights   08/22/20 1540  Weight: 231 lb (104.8 kg)     RADIOGRAPHIC STUDIES: I have personally reviewed the radiological reports and agreed with the findings in the report.  ASSESSMENT AND PLAN:  Malignant neoplasm of upper-outer quadrant of right breast in female, estrogen receptor positive ([place]) Patient palpated a right breast mass. Diagnostic mammogram and [place] on 08/04/20 showed a 1.9cm mass at the 11:30 position in the right breast and no right axillary adenopathy. Biopsy on 08/09/20 showed invasive ductal carcinoma, grade 2/3, HER-2 negative (1+), ER+ 70%, PR+ 50%, Ki67 15%  Pathology and radiology counseling:Discussed with the patient, the details of pathology including the type of breast cancer,the clinical staging, the significance of ER, PR and HER-2/neu receptors and the implications for treatment. After reviewing the pathology in detail, we proceeded to discuss the different treatment options between surgery, radiation, chemotherapy, antiestrogen therapies.  Recommendations: 1. Breast conserving surgery followed by 2. Oncotype DX testing to determine if chemotherapy would be of any benefit followed by 3. Adjuvant radiation therapy followed by 4. Adjuvant antiestrogen therapy We recommend genetic testing because of the contralateral breast cancer. Also she has a sister who had breast cancer and father had prostate cancer.  Oncotype counseling: I discussed Oncotype DX test. I explained to the patient that this is a 21 gene panel to evaluate patient tumors DNA to calculate recurrence score. This would help determine whether patient has high risk or low risk breast  cancer. She understands that if her tumor was found to be high risk, she would benefit from systemic chemotherapy. If low risk, no need of chemotherapy.  Return to clinic after surgery to discuss final pathology report and then determine if Oncotype DX testing will need to be sent. We can do a MyChart virtual visit to discuss the report She is going to go to a beach vacation the day after surgery and spend a week.    All questions were answered. The patient knows to call the clinic with any problems, questions or concerns.   Rulon Eisenmenger, MD, MPH 08/22/2020    I, Molly Dorshimer, am acting as scribe for Nicholas Lose, MD.  I have reviewed the above documentation for accuracy and completeness, and I agree with the above.

## 2020-08-22 ENCOUNTER — Encounter (HOSPITAL_BASED_OUTPATIENT_CLINIC_OR_DEPARTMENT_OTHER): Payer: Self-pay | Admitting: General Surgery

## 2020-08-22 ENCOUNTER — Other Ambulatory Visit: Payer: Self-pay

## 2020-08-22 ENCOUNTER — Inpatient Hospital Stay: Payer: PPO | Attending: Hematology and Oncology | Admitting: Hematology and Oncology

## 2020-08-22 DIAGNOSIS — Z803 Family history of malignant neoplasm of breast: Secondary | ICD-10-CM | POA: Insufficient documentation

## 2020-08-22 DIAGNOSIS — C50411 Malignant neoplasm of upper-outer quadrant of right female breast: Secondary | ICD-10-CM | POA: Insufficient documentation

## 2020-08-22 DIAGNOSIS — Z170 Estrogen receptor positive status [ER+]: Secondary | ICD-10-CM | POA: Diagnosis not present

## 2020-08-22 NOTE — Assessment & Plan Note (Addendum)
Patient palpated a right breast mass. Diagnostic mammogram and [place] on 08/04/20 showed a 1.9cm mass at the 11:30 position in the right breast and no right axillary adenopathy. Biopsy on 08/09/20 showed invasive ductal carcinoma, grade 2/3, HER-2 negative (1+), ER+ 70%, PR+ 50%, Ki67 15%  Pathology and radiology counseling:Discussed with the patient, the details of pathology including the type of breast cancer,the clinical staging, the significance of ER, PR and HER-2/neu receptors and the implications for treatment. After reviewing the pathology in detail, we proceeded to discuss the different treatment options between surgery, radiation, chemotherapy, antiestrogen therapies.  Recommendations: 1. Breast conserving surgery followed by 2. Oncotype DX testing to determine if chemotherapy would be of any benefit followed by 3. Adjuvant radiation therapy followed by 4. Adjuvant antiestrogen therapy  Oncotype counseling: I discussed Oncotype DX test. I explained to the patient that this is a 21 gene panel to evaluate patient tumors DNA to calculate recurrence score. This would help determine whether patient has high risk or low risk breast cancer. She understands that if her tumor was found to be high risk, she would benefit from systemic chemotherapy. If low risk, no need of chemotherapy.  Return to clinic after surgery to discuss final pathology report and then determine if Oncotype DX testing will need to be sent.

## 2020-08-23 ENCOUNTER — Encounter: Payer: Self-pay | Admitting: *Deleted

## 2020-08-23 ENCOUNTER — Telehealth: Payer: Self-pay | Admitting: *Deleted

## 2020-08-23 ENCOUNTER — Telehealth: Payer: Self-pay | Admitting: Hematology and Oncology

## 2020-08-23 NOTE — Telephone Encounter (Signed)
Scheduled appointment per 05/24 los. Patient will receive updated calender. 

## 2020-08-23 NOTE — Progress Notes (Signed)

## 2020-08-23 NOTE — Progress Notes (Addendum)
New Breast Cancer Diagnosis: Right Breast UOQ   Did patient present with symptoms (if so, please note symptoms) or screening mammography?:Palpable mass    Location and Extent of disease :right breast. Located at 11:30  position, measured  1.9 cm in greatest dimension. Adenopathy no.  Histology per Pathology Report: grade 2-3, Invasive Ductal Carcinoma 08/09/2020  Receptor Status: ER(positive), PR (positive), Her2-neu (negative), Ki-(15%)  Surgeon and surgical plan, if any: Dr. [name] - Right Breast Lumpectomy with SLN biopsy 08/31/2020   Medical oncologist, treatment if any:   Dr. Lindi Adie 08/22/2020 Recommendations: 1. Breast conserving surgery followed by 2. Oncotype DX testing to determine if chemotherapy would be of any benefit followed by 3. Adjuvant radiation therapy followed by 4. Adjuvant antiestrogen therapy -We recommend genetic testing because of the contralateral breast cancer. -Also she has a sister who had breast cancer and father had prostate cancer.   Family History of Breast/Ovarian/Prostate Cancer: Sister had breast cancer, father had prostate cancer, Paternal Cousin had breast cancer.  Lymphedema issues, if any: None  Pain issues, if any: None   SAFETY ISSUES: Prior radiation? Left Breast 1999, Dr. Elba Barman, 33 treatments Pacemaker/ICD? No Possible current pregnancy? Postmenopausal Is the patient on methotrexate? No  Current Complaints / other details:   -Left Lumpectomy with radiation therapy 1999

## 2020-08-23 NOTE — Telephone Encounter (Signed)
Spoke to pt, provided navigation resources and contact information. Denies questions or concern regarding dx or treatment care plan. Encourage pt to call with needs.

## 2020-08-24 ENCOUNTER — Ambulatory Visit
Admission: RE | Admit: 2020-08-24 | Discharge: 2020-08-24 | Disposition: A | Discharge status: Home / Self Care | Payer: PPO | Source: Ambulatory Visit | Attending: Radiation Oncology | Admitting: Radiation Oncology

## 2020-08-24 ENCOUNTER — Inpatient Hospital Stay (HOSPITAL_BASED_OUTPATIENT_CLINIC_OR_DEPARTMENT_OTHER): Payer: PPO | Admitting: Genetic Counselor

## 2020-08-24 ENCOUNTER — Encounter: Payer: Self-pay | Admitting: Radiation Oncology

## 2020-08-24 ENCOUNTER — Inpatient Hospital Stay: Payer: PPO

## 2020-08-24 ENCOUNTER — Encounter: Payer: Self-pay | Admitting: Genetic Counselor

## 2020-08-24 ENCOUNTER — Other Ambulatory Visit: Payer: Self-pay

## 2020-08-24 VITALS — BP 139/89 | HR 79 | Temp 96.9°F | Resp 18 | Ht 68.0 in | Wt 230.0 lb

## 2020-08-24 DIAGNOSIS — Z853 Personal history of malignant neoplasm of breast: Secondary | ICD-10-CM | POA: Diagnosis not present

## 2020-08-24 DIAGNOSIS — Z923 Personal history of irradiation: Secondary | ICD-10-CM | POA: Diagnosis not present

## 2020-08-24 DIAGNOSIS — Z8041 Family history of malignant neoplasm of ovary: Secondary | ICD-10-CM | POA: Diagnosis not present

## 2020-08-24 DIAGNOSIS — Z170 Estrogen receptor positive status [ER+]: Secondary | ICD-10-CM | POA: Insufficient documentation

## 2020-08-24 DIAGNOSIS — Z803 Family history of malignant neoplasm of breast: Secondary | ICD-10-CM | POA: Diagnosis not present

## 2020-08-24 DIAGNOSIS — Z8042 Family history of malignant neoplasm of prostate: Secondary | ICD-10-CM

## 2020-08-24 DIAGNOSIS — C50411 Malignant neoplasm of upper-outer quadrant of right female breast: Secondary | ICD-10-CM | POA: Diagnosis not present

## 2020-08-24 DIAGNOSIS — K219 Gastro-esophageal reflux disease without esophagitis: Secondary | ICD-10-CM | POA: Diagnosis not present

## 2020-08-24 DIAGNOSIS — E7800 Pure hypercholesterolemia, unspecified: Secondary | ICD-10-CM | POA: Insufficient documentation

## 2020-08-24 DIAGNOSIS — Z79899 Other long term (current) drug therapy: Secondary | ICD-10-CM | POA: Diagnosis not present

## 2020-08-24 NOTE — Progress Notes (Signed)
Radiation Oncology         [phone] ________________________________  Name: Gina Walsh        MRN: [id number]  Date of Service: 08/24/2020 DOB: 1950-09-18  NL:[id number], Gina Kaufmann, MD  Gina Kussmaul, MD     REFERRING PHYSICIAN: Autumn Walsh III, MD   DIAGNOSIS: The encounter diagnosis was Malignant neoplasm of upper-outer quadrant of right breast in female, estrogen receptor positive ([place]).   HISTORY OF PRESENT ILLNESS: Gina Walsh is a 70 y.o. female seen in the multidisciplinary breast clinic for a new diagnosis of right breast cancer. The patient was noted to have a history of left breast cancer in 1999 that she underwent lumpectomy for and subsequent 6 1/2 weeks of radiotherapy under the care of Dr. Elba Barman. She has been without disease in the left breast since, but recently noted a palpable mass in the right breast. Diagnostic imaging measured this at 1.9 cm in the 11:30 position and her axilla was negative for adenopathy. A biopsy on 08/09/20 showed a grade 2-3 invasive ductal carcinoma that was ER/PR positive, HER2 negative with a Ki 67 of 15%. She is planning to undergo right lumpectomy on 08/31/20. She's also met with genetic testing but these results would not influence her upcoming surgery per report. She's seen today to discuss treatment recommendations for her cancer.     PREVIOUS RADIATION THERAPY:   1999:  The patient's left breast was treated with adjuvant radiotherapy under the care of Dr. Elba Barman.   PAST MEDICAL HISTORY:  Past Medical History:  Diagnosis Date  . Anxiety   . Breast cancer ([place])   . Depression   . Family history of breast cancer   . Family history of prostate cancer   . GERD (gastroesophageal reflux disease)   . Hypercholesteremia   . Personal history of radiation therapy 1999  . Pre-diabetes        PAST SURGICAL HISTORY: Past Surgical History:  Procedure Laterality Date  . BREAST EXCISIONAL BIOPSY Bilateral 02/03/1998   malignant  .  BREAST LUMPECTOMY  1999   left lumpectomy for cancer  . COLONOSCOPY  2004     FAMILY HISTORY:  Family History  Problem Relation Age of Onset  . Breast cancer Sister 41  . Prostate cancer Father 54       d. 107  . Congestive Heart Failure Mother   . Stroke Maternal Grandmother 58       d. 49  . Lung disease Maternal Grandfather        ? Brown lung  . Bowel Disease Paternal Grandmother   . Breast cancer Cousin 34       pat first cousin's daughter  . Colon cancer Neg Hx   . Esophageal cancer Neg Hx   . Rectal cancer Neg Hx   . Stomach cancer Neg Hx      SOCIAL HISTORY:  reports that she has never smoked. She has never used smokeless tobacco. She reports current alcohol use of about 1.0 standard drink of alcohol per week. She reports that she does not use drugs. The patient is married. She's a retired Arboriculturist. She enjoys being around her family especially with her grand children.   ALLERGIES: Sulfa antibiotics   MEDICATIONS:  Current Outpatient Medications  Medication Sig Dispense Refill  . albuterol (VENTOLIN HFA) 108 (90 Base) MCG/ACT inhaler 2 puffs as needed    . atorvastatin (LIPITOR) 40 MG tablet Take 40 mg by mouth daily.    Marland Kitchen  Calcium Carbonate Antacid (TUMS E-X 750 PO) Take 2 tablets by mouth daily.    Marland Kitchen escitalopram (LEXAPRO) 20 MG tablet Take 20 mg by mouth daily.    . traZODone (DESYREL) 50 MG tablet Take 50 mg by mouth at bedtime.     No current facility-administered medications for this encounter.     REVIEW OF SYSTEMS: On review of systems, the patient reports that she is doing well overall. She denies any chest pain, shortness of breath, cough, fevers, chills, night sweats, unintended weight changes. She denies any bowel or bladder disturbances, and denies abdominal pain, nausea or vomiting. She denies any new musculoskeletal or joint aches or pains. A complete review of systems is obtained and is otherwise negative.     PHYSICAL EXAM:  Wt Readings  from Last 3 Encounters:  08/24/20 230 lb (104.3 kg)  08/22/20 231 lb (104.8 kg)  09/24/16 216 lb (98 kg)   Temp Readings from Last 3 Encounters:  08/24/20 (!) 96.9 F (36.1 C) (Temporal)  08/22/20 (!) 97.5 F (36.4 C)  09/24/16 98.4 F (36.9 C)   BP Readings from Last 3 Encounters:  08/24/20 139/89  08/22/20 (!) 143/91  09/24/16 131/64   Pulse Readings from Last 3 Encounters:  08/24/20 79  08/22/20 91  09/24/16 72    In general this is a well appearing caucasian female in no acute distress. She's alert and oriented x4 and appropriate throughout the examination. Cardiopulmonary assessment is negative for acute distress and she exhibits normal effort. The left breast is well healed with mild thickening from prior radiation fibrosis. The breast is smaller in size compared to the right breast. No other visible findings are noted.     ECOG = 0  0 - Asymptomatic (Fully active, able to carry on all predisease activities without restriction)  1 - Symptomatic but completely ambulatory (Restricted in physically strenuous activity but ambulatory and able to carry out work of a light or sedentary nature. For example, light housework, office work)  2 - Symptomatic, <50% in bed during the day (Ambulatory and capable of all self care but unable to carry out any work activities. Up and about more than 50% of waking hours)  3 - Symptomatic, >50% in bed, but not bedbound (Capable of only limited self-care, confined to bed or chair 50% or more of waking hours)  4 - Bedbound (Completely disabled. Cannot carry on any self-care. Totally confined to bed or chair)  5 - Death   Eustace Pen MM, Creech RH, Tormey DC, et al. [phone]). "Toxicity and response criteria of the [facility]". [place] Oncol. 5 (6): 649-55    LABORATORY DATA:  No results found for: WBC, HGB, HCT, MCV, PLT No results found for: NA, K, CL, CO2 No results found for: ALT, AST, GGT, ALKPHOS, BILITOT     RADIOGRAPHY: US BREAST LTD UNI RIGHT INC AXILLA  Result Date: 08/04/2020 CLINICAL DATA:  Mass felt by the patient in the upper outer right breast for the past 3 days. Status post left lumpectomy and radiation therapy for breast cancer in 1999. EXAM: DIGITAL DIAGNOSTIC UNILATERAL RIGHT MAMMOGRAM WITH TOMOSYNTHESIS AND CAD; ULTRASOUND RIGHT BREAST LIMITED TECHNIQUE: Right digital diagnostic mammography and breast tomosynthesis was performed. The images were evaluated with computer-aided detection.; Targeted ultrasound examination of the right breast was performed COMPARISON:  Previous exam(s). ACR Breast Density Category b: There are scattered areas of fibroglandular density. FINDINGS: An irregular, spiculated mass is demonstrated in the upper outer right  breast at the location of the mass felt by the patient, marked with a metallic marker. No findings elsewhere in the breast suspicious for malignancy. On physical exam, the patient has an approximately 2 cm mildly irregular, firm, palpable mass in the 11:30 o'clock position of the right breast, 6 cm from the nipple. There are no palpable right axillary lymph nodes. Targeted ultrasound is performed, showing a 1.9 x 1.4 x 1.3 cm irregular, hypoechoic mass with indistinct margins and mild ill-defined surrounding increased echogenicity in the 11:30 o'clock position of the right breast, 6 cm from the nipple. Ultrasound of the right axilla demonstrated normal appearing right axillary lymph nodes. IMPRESSION: 1. 1.9 cm palpable mass in the 11:30 o'clock position of the right breast with imaging features highly suspicious for malignancy. 2. No right axillary adenopathy. RECOMMENDATION: Ultrasound-guided core needle biopsy of the 1.9 cm mass in the 11:30 o'clock position of the right breast. This has been discussed with the patient and scheduled at 7:30 a.m. on 08/09/2020. I have discussed the findings and recommendations with the patient. If applicable, a reminder letter  will be sent to the patient regarding the next appointment. BI-RADS CATEGORY  5: Highly suggestive of malignancy. Electronically Signed   By: Claudie Revering M.D.   On: 08/04/2020 16:51   MM DIAG BREAST TOMO UNI LEFT  Result Date: 08/14/2020 CLINICAL DATA:  70 year old female presenting for routine exam of the left breast. Patient was recently diagnosed with right breast cancer. EXAM: DIGITAL DIAGNOSTIC UNILATERAL LEFT MAMMOGRAM WITH TOMOSYNTHESIS AND CAD TECHNIQUE: Left digital diagnostic mammography and breast tomosynthesis was performed. The images were evaluated with computer-aided detection. COMPARISON:  Previous exam(s). ACR Breast Density Category b: There are scattered areas of fibroglandular density. FINDINGS: There are stable postsurgical changes. No suspicious mass, distortion, or microcalcifications are identified to suggest presence of malignancy. IMPRESSION: No mammographic evidence of malignancy in the left breast. RECOMMENDATION: Proceed with recommended surgical management of the right breast. I have discussed the findings and recommendations with the patient. If applicable, a reminder letter will be sent to the patient regarding the next appointment. BI-RADS CATEGORY  1: Negative. Electronically Signed   By: Audie Pinto M.D.   On: 08/14/2020 13:02   MM DIAG BREAST TOMO UNI RIGHT  Result Date: 08/04/2020 CLINICAL DATA:  Mass felt by the patient in the upper outer right breast for the past 3 days. Status post left lumpectomy and radiation therapy for breast cancer in 1999. EXAM: DIGITAL DIAGNOSTIC UNILATERAL RIGHT MAMMOGRAM WITH TOMOSYNTHESIS AND CAD; ULTRASOUND RIGHT BREAST LIMITED TECHNIQUE: Right digital diagnostic mammography and breast tomosynthesis was performed. The images were evaluated with computer-aided detection.; Targeted ultrasound examination of the right breast was performed COMPARISON:  Previous exam(s). ACR Breast Density Category b: There are scattered areas of  fibroglandular density. FINDINGS: An irregular, spiculated mass is demonstrated in the upper outer right breast at the location of the mass felt by the patient, marked with a metallic marker. No findings elsewhere in the breast suspicious for malignancy. On physical exam, the patient has an approximately 2 cm mildly irregular, firm, palpable mass in the 11:30 o'clock position of the right breast, 6 cm from the nipple. There are no palpable right axillary lymph nodes. Targeted ultrasound is performed, showing a 1.9 x 1.4 x 1.3 cm irregular, hypoechoic mass with indistinct margins and mild ill-defined surrounding increased echogenicity in the 11:30 o'clock position of the right breast, 6 cm from the nipple. Ultrasound of the right axilla demonstrated normal appearing right  axillary lymph nodes. IMPRESSION: 1. 1.9 cm palpable mass in the 11:30 o'clock position of the right breast with imaging features highly suspicious for malignancy. 2. No right axillary adenopathy. RECOMMENDATION: Ultrasound-guided core needle biopsy of the 1.9 cm mass in the 11:30 o'clock position of the right breast. This has been discussed with the patient and scheduled at 7:30 a.m. on 08/09/2020. I have discussed the findings and recommendations with the patient. If applicable, a reminder letter will be sent to the patient regarding the next appointment. BI-RADS CATEGORY  5: Highly suggestive of malignancy. Electronically Signed   By: Claudie Revering M.D.   On: 08/04/2020 16:51   MM CLIP PLACEMENT RIGHT  Result Date: 08/09/2020 CLINICAL DATA:  70 year old female status post ultrasound-guided biopsy of the right breast. EXAM: DIAGNOSTIC RIGHT MAMMOGRAM POST ULTRASOUND BIOPSY COMPARISON:  Previous exam(s). FINDINGS: Mammographic images were obtained following ultrasound guided biopsy of the upper outer right breast. The biopsy marking clip is in expected position at the site of biopsy. IMPRESSION: Appropriate positioning of the ribbon shaped  biopsy marking clip at the site of biopsy in the upper-outer right breast. Final Assessment: Post Procedure Mammograms for Marker Placement Electronically Signed   By: Kristopher Oppenheim M.D.   On: 08/09/2020 08:29   [place] RT BREAST BX W LOC DEV 1ST LESION IMG BX SPEC US GUIDE  Addendum Date: 08/10/2020   ADDENDUM REPORT: 08/10/2020 14:25 ADDENDUM: Pathology revealed GRADE 2/3 INVASIVE MAMMARY CARCINOMA of the RIGHT breast, 11:30 o'clock, 6cmfn. This was found to be concordant by Dr. Kristopher Oppenheim. Pathology results were discussed with the patient by telephone. The patient reported doing well after the biopsy with tenderness at the site. Post biopsy instructions and care were reviewed and questions were answered. The patient was encouraged to call [facility] for any additional concerns. Surgical consultation has been arranged with Dr. Autumn Walsh at [facility] on Aug 17, 2020. Consider MRI given breast density and for evaluation of contralateral breast. Pathology results reported by Stacie Acres RN on 08/10/2020. Electronically Signed   By: Kristopher Oppenheim M.D.   On: 08/10/2020 14:25   Result Date: 08/10/2020 CLINICAL DATA:  70 year old female with a suspicious right breast mass. EXAM: ULTRASOUND GUIDED RIGHT BREAST CORE NEEDLE BIOPSY COMPARISON:  Previous exam(s). PROCEDURE: I met with the patient and we discussed the procedure of ultrasound-guided biopsy, including benefits and alternatives. We discussed the high likelihood of a successful procedure. We discussed the risks of the procedure, including infection, bleeding, tissue injury, clip migration, and inadequate sampling. Informed written consent was given. The usual time-out protocol was performed immediately prior to the procedure. Lesion quadrant: Upper outer quadrant Using sterile technique and 1% Lidocaine as local anesthetic, under direct ultrasound visualization, a 12 gauge spring-loaded device was used to perform  biopsy of a mass at the 11:30 position using a lateral approach. At the conclusion of the procedure a ribbon shaped tissue marker clip was deployed into the biopsy cavity. Follow up 2 view mammogram was performed and dictated separately. IMPRESSION: Ultrasound guided biopsy of the right breast. No apparent complications. Electronically Signed: By: Kristopher Oppenheim M.D. On: 08/09/2020 08:23       IMPRESSION/PLAN: 1. Stage IA, [id number] grade 2-3 ER/PR positive invasive ductal carcinoma of the right breast. Dr. Lisbeth Renshaw discusses the pathology findings and reviews the nature of early right breast disease. The consensus from the breast conference includes breast conservation with lumpectomy with sentinel node biopsy. Dr. Lindi Adie recommends Oncotype Dx  score to determine a role for systemic therapy. Provided that chemotherapy is not indicated, the patient's course would then be followed by external radiotherapy to the breast  to reduce risks of local recurrence followed by antiestrogen therapy. We discussed the risks, benefits, short, and long term effects of radiotherapy, as well as the curative intent, and the patient is interested in proceeding. Dr. Lisbeth Renshaw discusses the delivery and logistics of radiotherapy and anticipates a course of 4 weeks of radiotherapy to the right breast. We will see her back a on 09/19/20 for simulation process and anticipate we starting radiotherapy the week of 09/25/20 as long as she's healed at that time as she has a beach trip she's trying to plan around.   2. Possible genetic predisposition to malignancy. The patient is a candidate for genetic testing given her personal and family history. She was offered referral and is awaiting test results. 3. Remote left breast cancer. This will be followed expectantly as well as she continues in surveillance for #1.    In a visit lasting 60 minutes, greater than 50% of the time was spent face to face reviewing her case, as well as in preparation of,  discussing, and coordinating the patient's care.  The above documentation reflects my direct findings during this shared patient visit. Please see the separate note by Dr. Lisbeth Renshaw on this date for the remainder of the patient's plan of care.    Carola Rhine, [facility]    **Disclaimer: This note was dictated with voice recognition software. Similar sounding words can inadvertently be transcribed and this note may contain transcription errors which may not have been corrected upon publication of note.**

## 2020-08-24 NOTE — Progress Notes (Addendum)
REFERRING PROVIDER: Nicholas Lose, MD [place],  [place] [address]  PRIMARY PROVIDER:  Merrilee Seashore, MD  PRIMARY REASON FOR VISIT:  1. Malignant neoplasm of upper-outer quadrant of right breast in female, estrogen receptor positive (Gina Walsh)   2. Family history of breast cancer   3. Family history of prostate cancer      HISTORY OF PRESENT ILLNESS:   Gina Walsh, a 70 y.o. female, was seen for a [facility] cancer genetics consultation at the request of Dr. Lindi Adie due to a personal and family history of breast cancer.  Gina Walsh presents to clinic today to discuss the possibility of a hereditary predisposition to cancer, genetic testing, and to further clarify her future cancer risks, as well as potential cancer risks for family members.   Gina Walsh is a 70 y.o. female who was diagnosed with breast cancer initially at age 25.  She was found to have another breast cancer in the contralateral breast at age 70.  Her sister underwent genetic testing in 2015 and was negative for BRCA1 and BRCA2 mutations, however the patient has never had genetic testing in the past.    CANCER HISTORY:  Oncology History  Malignant neoplasm of upper-outer quadrant of right breast in female, estrogen receptor positive ([place])  1997 Pathology Results   History of left breast DCIS status postlumpectomy (Dr. Margot Chimes) and radiation, did not receive antiestrogen therapy   08/09/2020 Initial Diagnosis   Patient palpated a right breast mass which was 1.9 cm by mammogram at 11:30 position right breast, axilla negative, biopsy revealed grade 2-3 IDC ER 70%, PR 50%, Ki-67 15%, HER2 negative      RISK FACTORS:  Menarche was at age 89.  First live birth at age 67.  OCP use for approximately 4-5 years.  Ovaries intact: yes.  Hysterectomy: no.  Menopausal status: postmenopausal.  HRT use: 0 years. Colonoscopy: yes; normal. Mammogram within the last year: yes. Number of breast  biopsies: 2. Up to date with pelvic exams: n/a. Any excessive radiation exposure in the past: no  Past Medical History:  Diagnosis Date  . Anxiety   . Breast cancer ([place])   . Depression   . Family history of breast cancer   . Family history of prostate cancer   . GERD (gastroesophageal reflux disease)   . Hypercholesteremia   . Personal history of radiation therapy 1999  . Pre-diabetes     Past Surgical History:  Procedure Laterality Date  . BREAST EXCISIONAL BIOPSY Bilateral 02/03/1998   malignant  . BREAST LUMPECTOMY  1999   left lumpectomy for cancer  . COLONOSCOPY  2004    Social History   Socioeconomic History  . Marital status: Married    Spouse name: Not on file  . Number of children: Not on file  . Years of education: Not on file  . Highest education level: Not on file  Occupational History  . Not on file  Tobacco Use  . Smoking status: Never Smoker  . Smokeless tobacco: Never Used  Substance and Sexual Activity  . Alcohol use: Yes    Alcohol/week: 1.0 standard drink    Types: 1 Glasses of wine per week    Comment: occassion  . Drug use: No  . Sexual activity: Not on file  Other Topics Concern  . Not on file  Social History Narrative  . Not on file   Social Determinants of Health   Financial Resource Strain: Not on file  Food Insecurity: Not  on file  Transportation Needs: Not on file  Physical Activity: Not on file  Stress: Not on file  Social Connections: Not on file     FAMILY HISTORY:  We obtained a detailed, 4-generation family history.  Significant diagnoses are listed below: Family History  Problem Relation Age of Onset  . Breast cancer Sister 67  . Prostate cancer Father 41       d. 85  . Congestive Heart Failure Mother   . Stroke Maternal Grandmother 64       d. 29  . Lung disease Maternal Grandfather        ? Brown lung  . Bowel Disease Paternal Grandmother   . Breast cancer Cousin 34       pat first cousin's daughter  .  Colon cancer Neg Hx   . Esophageal cancer Neg Hx   . Rectal cancer Neg Hx   . Stomach cancer Neg Hx     The patient has one daughter who is cancer free.  She has a sister who was diagnosed with breast cancer at 76 and is negative for BRCA mutations as of 06/04/2013.  Both parents are deceased.  The patient's mother died of CHF at 52.  She was an only child.  Her parents are deceased from non-cancer related issues.  The patient's father was diagnosed with prostate cancer at 52 and died of it at 33.  He had two sisters and a brother who were all cancer free, however one sister had a granddaughter who had breast cancer at 49.  The paternal grandparents died of non-cancer related issues.  Gina Walsh is unaware of previous family history of genetic testing for hereditary cancer risks. Patient's maternal ancestors are of [place] descent, and paternal ancestors are of [place] descent. There is no reported Ashkenazi Jewish ancestry. There is no known consanguinity.  GENETIC COUNSELING ASSESSMENT: Gina Walsh is a 70 y.o. female with a personal and family history of breast cancer which is somewhat suggestive of a hereditary breast cancer syndrome and predisposition to cancer given the young ages of onset and combination of cancer. We, therefore, discussed and recommended the following at today's visit.   DISCUSSION: We discussed that 5 - 10% of breast cancer is hereditary, with most cases associated with BRCA mutations.  Based on her sister's previously negative testing, BRCA mutations are less likely the cause of cancer in the family.  There are other genes that can be associated with hereditary breast cancer syndromes.  These include ATM, CHEK2 and PALB2.  We discussed that testing is beneficial for several reasons including knowing how to follow individuals after completing their treatment, identifying whether potential treatment options such as PARP inhibitors would be beneficial, and understand if  other family members could be at risk for cancer and allow them to undergo genetic testing.   We reviewed the characteristics, features and inheritance patterns of hereditary cancer syndromes. We also discussed genetic testing, including the appropriate family members to test, the process of testing, insurance coverage and turn-around-time for results. We discussed the implications of a negative, positive, carrier and/or variant of uncertain significant result. We recommended Gina Walsh pursue genetic testing for a 9-gene stat panel. This should take approximately 7-12 days to get back. Regardless of the result, we will reflex to the CancerNext-Expanded+RNAinsight gene panel. The CancerNext-Expanded gene panel offered by [facility] and includes sequencing and rearrangement analysis for the following 77 genes: AIP, ALK, APC*, ATM*, AXIN2, BAP1, BARD1, BLM, BMPR1A, BRCA1*, BRCA2*,  BRIP1*, CDC73, CDH1*, CDK4, CDKN1B, CDKN2A, CHEK2*, CTNNA1, DICER1, FANCC, FH, FLCN, GALNT12, KIF1B, LZTR1, MAX, MEN1, MET, MLH1*, MSH2*, MSH3, MSH6*, MUTYH*, NBN, NF1*, NF2, NTHL1, PALB2*, PHOX2B, PMS2*, POT1, PRKAR1A, PTCH1, PTEN*, RAD51C*, RAD51D*, RB1, RECQL, RET, SDHA, SDHAF2, SDHB, SDHC, SDHD, SMAD4, SMARCA4, SMARCB1, SMARCE1, STK11, SUFU, TMEM127, TP53*, TSC1, TSC2, VHL and XRCC2 (sequencing and deletion/duplication); EGFR, EGLN1, HOXB13, KIT, MITF, PDGFRA, POLD1, and POLE (sequencing only); EPCAM and GREM1 (deletion/duplication only). DNA and RNA analyses performed for * genes.   Based on Gina Walsh's personal and family history of cancer, she meets medical criteria for genetic testing. Despite that she meets criteria, she may still have an out of pocket cost. We discussed that if her out of pocket cost for testing is over $100, the laboratory will call and confirm whether she wants to proceed with testing.  If the out of pocket cost of testing is less than $100 she will be billed by the genetic testing laboratory.    PLAN: After considering the risks, benefits, and limitations, Gina Walsh provided informed consent to pursue genetic testing and the blood sample was sent to [facility] for analysis of the CancerNext-Expanded+RNAinsight. Results should be available within approximately 2-3 weeks' time, at which point they will be disclosed by telephone to Gina Walsh, as will any additional recommendations warranted by these results. Gina Walsh will receive a summary of her genetic counseling visit and a copy of her results once available. This information will also be available in Epic.   Lastly, we encouraged Gina Walsh to remain in contact with cancer genetics annually so that we can continuously update the family history and inform her of any changes in cancer genetics and testing that may be of benefit for this family.   Gina Walsh questions were answered to her satisfaction today. Our contact information was provided should additional questions or concerns arise. Thank you for the referral and allowing [place] to share in the care of your patient.   Gina Walsh P. Florene Glen, [place], [facility] Licensed, Insurance risk surveyor [email or web address] phone: [phone]  The patient was seen for a total of 35 minutes in face-to-face genetic counseling.  This patient was discussed with Drs. Magrinat, Lindi Adie and/or Burr Medico who agrees with the above.    _______________________________________________________________________ For Office Staff:  Number of people involved in session: 1 Was an Intern/ student involved with case: no

## 2020-08-25 ENCOUNTER — Encounter: Payer: Self-pay | Admitting: Licensed Clinical Social Worker

## 2020-08-25 NOTE — Progress Notes (Signed)
[facility] Psychosocial Distress Screening Clinical Social Work  Clinical Social Work was referred by distress screening protocol.  The patient scored a 5 on the Psychosocial Distress Thermometer which indicates moderate distress. Clinical Social Worker contacted patient by phone to assess for distress and other psychosocial needs.   Patient reports doing fairly well right now. She has felt well supported by the medical team and also has strong support with family and friends. She denied any resource needs (transportation, food, financial) at this time. CSW provided information on support services as well as direct contact information for pt to reach out should needs arise.  ONCBCN DISTRESS SCREENING 08/24/2020  Screening Type Initial Screening  Distress experienced in past week (1-10) 5  Family Problem type Other (comment)   Clinical Social Worker follow up needed: No.  If yes, follow up plan:  Amiyrah Lamere E Johnell Bas, LCSW

## 2020-08-30 HISTORY — PX: BREAST LUMPECTOMY: SHX2

## 2020-08-31 ENCOUNTER — Ambulatory Visit (HOSPITAL_BASED_OUTPATIENT_CLINIC_OR_DEPARTMENT_OTHER)
Admission: RE | Admit: 2020-08-31 | Discharge: 2020-08-31 | Disposition: A | Discharge status: Home / Self Care | Payer: PPO | Attending: General Surgery | Admitting: General Surgery

## 2020-08-31 ENCOUNTER — Encounter (HOSPITAL_BASED_OUTPATIENT_CLINIC_OR_DEPARTMENT_OTHER)
Admission: RE | Disposition: A | Discharge status: Home / Self Care | Payer: Self-pay | Source: Home / Self Care | Attending: General Surgery

## 2020-08-31 ENCOUNTER — Other Ambulatory Visit: Payer: Self-pay

## 2020-08-31 ENCOUNTER — Encounter (HOSPITAL_BASED_OUTPATIENT_CLINIC_OR_DEPARTMENT_OTHER): Payer: Self-pay | Admitting: General Surgery

## 2020-08-31 ENCOUNTER — Ambulatory Visit (HOSPITAL_BASED_OUTPATIENT_CLINIC_OR_DEPARTMENT_OTHER): Payer: PPO | Admitting: Anesthesiology

## 2020-08-31 ENCOUNTER — Ambulatory Visit (HOSPITAL_COMMUNITY)
Admission: RE | Admit: 2020-08-31 | Discharge: 2020-08-31 | Disposition: A | Discharge status: Home / Self Care | Payer: PPO | Source: Ambulatory Visit | Attending: General Surgery | Admitting: General Surgery

## 2020-08-31 DIAGNOSIS — Z86000 Personal history of in-situ neoplasm of breast: Secondary | ICD-10-CM | POA: Insufficient documentation

## 2020-08-31 DIAGNOSIS — Z79899 Other long term (current) drug therapy: Secondary | ICD-10-CM | POA: Insufficient documentation

## 2020-08-31 DIAGNOSIS — Z882 Allergy status to sulfonamides status: Secondary | ICD-10-CM | POA: Diagnosis not present

## 2020-08-31 DIAGNOSIS — Z923 Personal history of irradiation: Secondary | ICD-10-CM | POA: Insufficient documentation

## 2020-08-31 DIAGNOSIS — C50911 Malignant neoplasm of unspecified site of right female breast: Secondary | ICD-10-CM | POA: Diagnosis not present

## 2020-08-31 DIAGNOSIS — C50411 Malignant neoplasm of upper-outer quadrant of right female breast: Secondary | ICD-10-CM | POA: Diagnosis not present

## 2020-08-31 DIAGNOSIS — Z170 Estrogen receptor positive status [ER+]: Secondary | ICD-10-CM

## 2020-08-31 DIAGNOSIS — Z803 Family history of malignant neoplasm of breast: Secondary | ICD-10-CM | POA: Diagnosis not present

## 2020-08-31 DIAGNOSIS — E7800 Pure hypercholesterolemia, unspecified: Secondary | ICD-10-CM | POA: Diagnosis not present

## 2020-08-31 DIAGNOSIS — G8918 Other acute postprocedural pain: Secondary | ICD-10-CM | POA: Diagnosis not present

## 2020-08-31 HISTORY — DX: Prediabetes: R73.03

## 2020-08-31 HISTORY — DX: Depression, unspecified: F32.A

## 2020-08-31 HISTORY — PX: BREAST LUMPECTOMY WITH SENTINEL LYMPH NODE BIOPSY: SHX5597

## 2020-08-31 HISTORY — DX: Gastro-esophageal reflux disease without esophagitis: K21.9

## 2020-08-31 SURGERY — BREAST LUMPECTOMY WITH SENTINEL LYMPH NODE BX
Anesthesia: General | Site: Breast | Laterality: Right

## 2020-08-31 MED ORDER — ACETAMINOPHEN 500 MG PO TABS
1000.0000 mg | ORAL_TABLET | ORAL | Status: AC
  Administered 2020-08-31: 1000 mg via ORAL

## 2020-08-31 MED ORDER — PHENYLEPHRINE HCL (PRESSORS) 10 MG/ML IV SOLN
INTRAVENOUS | Status: DC | PRN
  Administered 2020-08-31: 80 ug via INTRAVENOUS

## 2020-08-31 MED ORDER — ONDANSETRON HCL 4 MG/2ML IJ SOLN
INTRAMUSCULAR | Status: DC | PRN
  Administered 2020-08-31: 4 mg via INTRAVENOUS

## 2020-08-31 MED ORDER — GABAPENTIN 300 MG PO CAPS
300.0000 mg | ORAL_CAPSULE | ORAL | Status: AC
  Administered 2020-08-31: 300 mg via ORAL

## 2020-08-31 MED ORDER — 0.9 % SODIUM CHLORIDE (POUR BTL) OPTIME
TOPICAL | Status: DC | PRN
  Administered 2020-08-31: 1000 mL

## 2020-08-31 MED ORDER — PROMETHAZINE HCL 25 MG/ML IJ SOLN: 6.2500 mg | INTRAMUSCULAR | Status: DC | PRN

## 2020-08-31 MED ORDER — TECHNETIUM TC 99M TILMANOCEPT KIT
1.0000 | PACK | Freq: Once | INTRAVENOUS | Status: AC | PRN
  Administered 2020-08-31: 1 via INTRADERMAL

## 2020-08-31 MED ORDER — OXYCODONE HCL 5 MG/5ML PO SOLN
5.0000 mg | Freq: Once | ORAL | Status: DC | PRN
Start: 2020-08-31 — End: 2020-08-31

## 2020-08-31 MED ORDER — HYDROMORPHONE HCL 1 MG/ML IJ SOLN: 0.2500 mg | INTRAMUSCULAR | Status: DC | PRN

## 2020-08-31 MED ORDER — LIDOCAINE HCL (CARDIAC) PF 100 MG/5ML IV SOSY
PREFILLED_SYRINGE | INTRAVENOUS | Status: DC | PRN
  Administered 2020-08-31: 40 mg via INTRAVENOUS

## 2020-08-31 MED ORDER — FENTANYL CITRATE (PF) 100 MCG/2ML IJ SOLN
INTRAMUSCULAR | Status: DC | PRN
  Administered 2020-08-31 (×2): 25 ug via INTRAVENOUS

## 2020-08-31 MED ORDER — MIDAZOLAM HCL 2 MG/2ML IJ SOLN
2.0000 mg | Freq: Once | INTRAMUSCULAR | Status: AC
  Administered 2020-08-31: 2 mg via INTRAVENOUS

## 2020-08-31 MED ORDER — LACTATED RINGERS IV SOLN: INTRAVENOUS | Status: DC

## 2020-08-31 MED ORDER — ACETAMINOPHEN 500 MG PO TABS
ORAL_TABLET | ORAL | Status: AC
  Filled 2020-08-31: qty 2

## 2020-08-31 MED ORDER — FENTANYL CITRATE (PF) 100 MCG/2ML IJ SOLN
100.0000 ug | Freq: Once | INTRAMUSCULAR | Status: AC
Start: 2020-08-31 — End: 2020-08-31
  Administered 2020-08-31: 50 ug via INTRAVENOUS

## 2020-08-31 MED ORDER — GABAPENTIN 300 MG PO CAPS
ORAL_CAPSULE | ORAL | Status: AC
  Filled 2020-08-31: qty 1

## 2020-08-31 MED ORDER — ROPIVACAINE HCL 5 MG/ML IJ SOLN
INTRAMUSCULAR | Status: DC | PRN
  Administered 2020-08-31: 30 mL via PERINEURAL

## 2020-08-31 MED ORDER — CEFAZOLIN SODIUM-DEXTROSE 2-4 GM/100ML-% IV SOLN
INTRAVENOUS | Status: AC
  Filled 2020-08-31: qty 100

## 2020-08-31 MED ORDER — CEFAZOLIN SODIUM-DEXTROSE 2-4 GM/100ML-% IV SOLN
2.0000 g | INTRAVENOUS | Status: AC
  Administered 2020-08-31: 2 g via INTRAVENOUS

## 2020-08-31 MED ORDER — MIDAZOLAM HCL 2 MG/2ML IJ SOLN
INTRAMUSCULAR | Status: AC
  Filled 2020-08-31: qty 2

## 2020-08-31 MED ORDER — METHYLENE BLUE 0.5 % INJ SOLN
INTRAVENOUS | Status: AC
  Filled 2020-08-31: qty 10

## 2020-08-31 MED ORDER — PROPOFOL 10 MG/ML IV BOLUS
INTRAVENOUS | Status: DC | PRN
  Administered 2020-08-31: 50 mg via INTRAVENOUS
  Administered 2020-08-31: 200 mg via INTRAVENOUS

## 2020-08-31 MED ORDER — HYDROCODONE-ACETAMINOPHEN 5-325 MG PO TABS: 1.0000 | ORAL_TABLET | Freq: Four times a day (QID) | ORAL | 0 refills | Status: DC | PRN

## 2020-08-31 MED ORDER — FENTANYL CITRATE (PF) 100 MCG/2ML IJ SOLN
INTRAMUSCULAR | Status: AC
  Filled 2020-08-31: qty 2

## 2020-08-31 MED ORDER — SODIUM CHLORIDE (PF) 0.9 % IJ SOLN
INTRAMUSCULAR | Status: AC
  Filled 2020-08-31: qty 10

## 2020-08-31 MED ORDER — PROPOFOL 10 MG/ML IV BOLUS
INTRAVENOUS | Status: AC
  Filled 2020-08-31: qty 20

## 2020-08-31 MED ORDER — AMISULPRIDE (ANTIEMETIC) 5 MG/2ML IV SOLN: 10.0000 mg | Freq: Once | INTRAVENOUS | Status: DC | PRN

## 2020-08-31 MED ORDER — DEXAMETHASONE SODIUM PHOSPHATE 4 MG/ML IJ SOLN
INTRAMUSCULAR | Status: DC | PRN
  Administered 2020-08-31: 5 mg via INTRAVENOUS

## 2020-08-31 MED ORDER — CHLORHEXIDINE GLUCONATE CLOTH 2 % EX PADS: 6.0000 | MEDICATED_PAD | Freq: Once | CUTANEOUS | Status: DC

## 2020-08-31 MED ORDER — OXYCODONE HCL 5 MG PO TABS
5.0000 mg | ORAL_TABLET | Freq: Once | ORAL | Status: DC | PRN
Start: 2020-08-31 — End: 2020-08-31

## 2020-08-31 MED ORDER — EPHEDRINE SULFATE 50 MG/ML IJ SOLN
INTRAMUSCULAR | Status: DC | PRN
  Administered 2020-08-31 (×3): 10 mg via INTRAVENOUS

## 2020-08-31 MED ORDER — LIDOCAINE HCL (PF) 2 % IJ SOLN
INTRAMUSCULAR | Status: AC
  Filled 2020-08-31: qty 5

## 2020-08-31 MED ORDER — BUPIVACAINE-EPINEPHRINE 0.25% -1:200000 IJ SOLN
INTRAMUSCULAR | Status: DC | PRN
  Administered 2020-08-31: 20 mL

## 2020-08-31 SURGICAL SUPPLY — 46 items
ADH SKN CLS APL DERMABOND .7 (GAUZE/BANDAGES/DRESSINGS) ×1
APL PRP STRL LF DISP 70% ISPRP (MISCELLANEOUS) ×1
APPLIER CLIP 11 MED OPEN (CLIP) ×2
APR CLP MED 11 20 MLT OPN (CLIP) ×1
BLADE SURG 15 STRL LF DISP TIS (BLADE) ×1 IMPLANT
BLADE SURG 15 STRL SS (BLADE) ×2
CANISTER SUCT 1200ML W/VALVE (MISCELLANEOUS) ×2 IMPLANT
CHLORAPREP W/TINT 26 (MISCELLANEOUS) ×2 IMPLANT
CLIP APPLIE 11 MED OPEN (CLIP) ×1 IMPLANT
CNTNR URN SCR LID CUP LEK RST (MISCELLANEOUS) IMPLANT
CONT SPEC 4OZ STRL OR WHT (MISCELLANEOUS) ×4
COVER BACK TABLE 60X90IN (DRAPES) ×2 IMPLANT
COVER MAYO STAND STRL (DRAPES) ×2 IMPLANT
COVER PROBE W GEL 5X96 (DRAPES) ×2 IMPLANT
COVER WAND RF STERILE (DRAPES) IMPLANT
DECANTER SPIKE VIAL GLASS SM (MISCELLANEOUS) ×2 IMPLANT
DERMABOND ADVANCED (GAUZE/BANDAGES/DRESSINGS) ×1
DERMABOND ADVANCED .7 DNX12 (GAUZE/BANDAGES/DRESSINGS) ×1 IMPLANT
DRAPE LAPAROSCOPIC ABDOMINAL (DRAPES) ×2 IMPLANT
DRAPE UTILITY XL STRL (DRAPES) ×2 IMPLANT
ELECT COATED BLADE 2.86 ST (ELECTRODE) ×2 IMPLANT
ELECT REM PT RETURN 9FT ADLT (ELECTROSURGICAL) ×2
ELECTRODE REM PT RTRN 9FT ADLT (ELECTROSURGICAL) ×1 IMPLANT
GLOVE SURG ENC MOIS LTX SZ7.5 (GLOVE) ×2 IMPLANT
GOWN STRL REUS W/ TWL LRG LVL3 (GOWN DISPOSABLE) ×2 IMPLANT
GOWN STRL REUS W/TWL LRG LVL3 (GOWN DISPOSABLE) ×4
ILLUMINATOR WAVEGUIDE N/F (MISCELLANEOUS) IMPLANT
KIT MARKER MARGIN INK (KITS) ×1 IMPLANT
LIGHT WAVEGUIDE WIDE FLAT (MISCELLANEOUS) IMPLANT
NDL HYPO 25X1 1.5 SAFETY (NEEDLE) ×1 IMPLANT
NDL SAFETY ECLIPSE 18X1.5 (NEEDLE) IMPLANT
NEEDLE HYPO 18GX1.5 SHARP (NEEDLE)
NEEDLE HYPO 25X1 1.5 SAFETY (NEEDLE) ×2 IMPLANT
NS IRRIG 1000ML POUR BTL (IV SOLUTION) ×2 IMPLANT
PACK BASIN DAY SURGERY FS (CUSTOM PROCEDURE TRAY) ×2 IMPLANT
PENCIL SMOKE EVACUATOR (MISCELLANEOUS) ×2 IMPLANT
SLEEVE SCD COMPRESS KNEE MED (STOCKING) ×2 IMPLANT
SPONGE LAP 18X18 RF (DISPOSABLE) ×2 IMPLANT
SUT MON AB 4-0 PC3 18 (SUTURE) ×2 IMPLANT
SUT SILK 3 0 PS 1 (SUTURE) ×1 IMPLANT
SUT VICRYL 3-0 CR8 SH (SUTURE) ×2 IMPLANT
SYR CONTROL 10ML LL (SYRINGE) ×2 IMPLANT
TOWEL GREEN STERILE FF (TOWEL DISPOSABLE) ×2 IMPLANT
TRAY FAXITRON CT DISP (TRAY / TRAY PROCEDURE) ×1 IMPLANT
TUBE CONNECTING 20X1/4 (TUBING) ×2 IMPLANT
YANKAUER SUCT BULB TIP NO VENT (SUCTIONS) ×2 IMPLANT

## 2020-08-31 NOTE — Progress Notes (Signed)
Nuc med at bedside for right side injections.  Pt tolerate well, and VSS.

## 2020-08-31 NOTE — H&P (Signed)
[place]  Location: [facility] Patient #: [id number] DOB: May 10, 1950 Married / Language: English / Race: White Female   History of Present Illness The patient is a 70 year old female who presents with breast cancer. We are asked to see the patient in consultation by Dr. Ashby Dawes to evaluate her for a new right breast cancer. The patient is a 70 year old white female who recently felt a mass in the upper outer quadrant of the right breast. She brought this to her doctor's attention. She was evaluated with mammogram and ultrasound and found to have a 1.9 centimeter mass in the upper outer quadrant with normal-looking lymph nodes on ultrasound. The mass was biopsied and came back as a invasive ductal cancer that was ER and PR positive and HER-2 negative with a Ki-67 of 15%. It appeared to be grade 2-3. She is otherwise in pretty good health and does not smoke. She does have a history of left breast ductal carcinoma in situ back in 1999 that was treated with lumpectomy and radiation. She also has a sister who has had breast cancer.   Past Surgical History Breast Biopsy  Bilateral. Breast Mass; Local Excision  Left. Colon Polyp Removal - Colonoscopy  Oral Surgery   Diagnostic Studies History  Colonoscopy  1-5 years ago Mammogram  within last year Pap Smear  1-5 years ago  Allergies  No Known Drug Allergies  Sulfa 10 *OPHTHALMIC AGENTS*  Allergies Reconciled   Medication History  Atorvastatin Calcium (40MG Tablet, Oral) Active. Escitalopram Oxalate (20MG Tablet, Oral) Active. traZODone HCl (50MG Tablet, Oral) Active. Medications Reconciled  Social History  Alcohol use  Occasional alcohol use. Caffeine use  Carbonated beverages, Coffee, Tea. No drug use  Tobacco use  Never smoker.  Family History  Breast Cancer  Sister. Colon Polyps  Father. Diabetes Mellitus  Mother. Heart Disease  Mother. Hypertension  Father,  Mother. Ischemic Bowel Disease  Mother. Prostate Cancer  Father.  Pregnancy / Birth History  Age at menarche  34 years. Age of menopause  51-55 Contraceptive History  Oral contraceptives. Gravida  1 Maternal age  7-30 Para  1  Other Problems Breast Cancer  Hypercholesterolemia  Lump In Breast     Review of Systems General Not Present- Appetite Loss, Chills, Fatigue, Fever, Night Sweats, Weight Gain and Weight Loss. Skin Not Present- Change in Wart/Mole, Dryness, Hives, Jaundice, New Lesions, Non-Healing Wounds, Rash and Ulcer. HEENT Not Present- Earache, Hearing Loss, Hoarseness, Nose Bleed, Oral Ulcers, Ringing in the Ears, Seasonal Allergies, Sinus Pain, Sore Throat, Visual Disturbances, Wears glasses/contact lenses and Yellow Eyes. Respiratory Not Present- Bloody sputum, Chronic Cough, Difficulty Breathing, Snoring and Wheezing. Breast Present- Breast Mass. Not Present- Breast Pain, Nipple Discharge and Skin Changes. Cardiovascular Not Present- Chest Pain, Difficulty Breathing Lying Down, Leg Cramps, Palpitations, Rapid Heart Rate, Shortness of Breath and Swelling of Extremities. Gastrointestinal Not Present- Abdominal Pain, Bloating, Bloody Stool, Change in Bowel Habits, Chronic diarrhea, Constipation, Difficulty Swallowing, Excessive gas, Gets full quickly at meals, Hemorrhoids, Indigestion, Nausea, Rectal Pain and Vomiting. Female Genitourinary Not Present- Frequency, Nocturia, Painful Urination, Pelvic Pain and Urgency. Musculoskeletal Not Present- Back Pain, Joint Pain, Joint Stiffness, Muscle Pain, Muscle Weakness and Swelling of Extremities. Neurological Not Present- Decreased Memory, Fainting, Headaches, Numbness, Seizures, Tingling, Tremor, Trouble walking and Weakness. Psychiatric Not Present- Anxiety, Bipolar, Change in Sleep Pattern, Depression, Fearful and Frequent crying. Endocrine Not Present- Cold Intolerance, Excessive Hunger, Hair Changes, Heat  Intolerance, Hot flashes and New Diabetes. Hematology Not  Present- Blood Thinners, Easy Bruising, Excessive bleeding, Gland problems, HIV and Persistent Infections.  Vitals  Weight: 230.5 lb Height: 68in Body Surface Area: 2.17 m Body Mass Index: 35.05 kg/m  Temp.: 97.16F  Pulse: 121 (Regular)  P.OX: 96% (Room air) BP: 136/80(Sitting, Left Arm, Standard)       Physical Exam  General Mental Status-Alert. General Appearance-Consistent with stated age. Hydration-Well hydrated. Voice-Normal.  Head and Neck Head-normocephalic, atraumatic with no lesions or palpable masses. Trachea-midline. Thyroid Gland Characteristics - normal size and consistency.  Eye Eyeball - Bilateral-Extraocular movements intact. Sclera/Conjunctiva - Bilateral-No scleral icterus.  Chest and Lung Exam Chest and lung exam reveals -quiet, even and easy respiratory effort with no use of accessory muscles and on auscultation, normal breath sounds, no adventitious sounds and normal vocal resonance. Inspection Chest Wall - Normal. Back - normal.  Breast Note: There is a 2 cm palpable mobile mass in the upper outer quadrant of the right breast. It does fill fairly superficial to the skin but not involving the skin. There is no palpable mass in the left breast. There is no palpable axillary, supraclavicular, or cervical lymphadenopathy.   Cardiovascular Cardiovascular examination reveals -normal heart sounds, regular rate and rhythm with no murmurs and normal pedal pulses bilaterally.  Abdomen Inspection Inspection of the abdomen reveals - No Hernias. Skin - Scar - no surgical scars. Palpation/Percussion Palpation and Percussion of the abdomen reveal - Soft, Non Tender, No Rebound tenderness, No Rigidity (guarding) and No hepatosplenomegaly. Auscultation Auscultation of the abdomen reveals - Bowel sounds normal.  Neurologic Neurologic evaluation reveals -alert and  oriented x 3 with no impairment of recent or remote memory. Mental Status-Normal.  Musculoskeletal Normal Exam - Left-Upper Extremity Strength Normal and Lower Extremity Strength Normal. Normal Exam - Right-Upper Extremity Strength Normal and Lower Extremity Strength Normal.  Lymphatic Head & Neck  General Head & Neck Lymphatics: Bilateral - Description - Normal. Axillary  General Axillary Region: Bilateral - Description - Normal. Tenderness - Non Tender. Femoral & Inguinal  Generalized Femoral & Inguinal Lymphatics: Bilateral - Description - Normal. Tenderness - Non Tender.    Assessment & Plan  MALIGNANT NEOPLASM OF UPPER-OUTER QUADRANT OF RIGHT BREAST IN FEMALE, ESTROGEN RECEPTOR POSITIVE (C50.411) Impression: The patient appears to have a 1.9 cm cancer in the upper outer quadrant of the right breast with clinically negative nodes. I have discussed with her in detail the different options for treatment and at this point she favors breast conservation which I feel is very reasonable. She is also a good candidate for sentinel node biopsy. I have discussed with her in detail the risks and benefits of the operation as well as some of the technical aspects and she understands and wishes to proceed. Since the cancer is palpable she will not need a radioactive seed. I will also refer her to a medical and radiation oncology to discuss adjuvant therapy. I will also refer her to genetics since this is her second cancer and she has a sister with breast cancer. She is going on vacation until June 11 so we may ask oncology to place her on an antiestrogen during that time until we can get her to the operating room. This patient encounter took 60 minutes today to perform the following: take history, perform exam, review outside records, interpret imaging, counsel the patient on their diagnosis and document encounter, findings & plan in the EHR Current Plans Referred to Oncology, for evaluation and  follow up (Oncology). Routine. Referred to Genetic Counseling,  for evaluation and follow up [facility]). Routine.

## 2020-08-31 NOTE — Discharge Instructions (Signed)
Tylenol not due until 4:05 today if needed.    Post Anesthesia Home Care Instructions  Activity: Get plenty of rest for the remainder of the day. A responsible individual must stay with you for 24 hours following the procedure.  For the next 24 hours, DO NOT: -Drive a car -Paediatric nurse -Drink alcoholic beverages -Take any medication unless instructed by your physician -Make any legal decisions or sign important papers.  Meals: Start with liquid foods such as gelatin or soup. Progress to regular foods as tolerated. Avoid greasy, spicy, heavy foods. If nausea and/or vomiting occur, drink only clear liquids until the nausea and/or vomiting subsides. Call your physician if vomiting continues.  Special Instructions/Symptoms: Your throat may feel dry or sore from the anesthesia or the breathing tube placed in your throat during surgery. If this causes discomfort, gargle with warm salt water. The discomfort should disappear within 24 hours.  If you had a scopolamine patch placed behind your ear for the management of post- operative nausea and/or vomiting:  1. The medication in the patch is effective for 72 hours, after which it should be removed.  Wrap patch in a tissue and discard in the trash. Wash hands thoroughly with soap and water. 2. You may remove the patch earlier than 72 hours if you experience unpleasant side effects which may include dry mouth, dizziness or visual disturbances. 3. Avoid touching the patch. Wash your hands with soap and water after contact with the patch.

## 2020-08-31 NOTE — Anesthesia Preprocedure Evaluation (Signed)
Anesthesia Evaluation  Patient identified by MRN, date of birth, ID band Patient awake    Reviewed: Allergy & Precautions, NPO status , Patient's Chart, lab work & pertinent test results  Airway Mallampati: II  TM Distance: >3 FB Neck ROM: Full    Dental no notable dental hx.    Pulmonary neg pulmonary ROS,    Pulmonary exam normal breath sounds clear to auscultation       Cardiovascular negative cardio ROS Normal cardiovascular exam Rhythm:Regular Rate:Normal     Neuro/Psych Anxiety Depression negative neurological ROS  negative psych ROS   GI/Hepatic Neg liver ROS, GERD  ,  Endo/Other  negative endocrine ROS  Renal/GU negative Renal ROS  negative genitourinary   Musculoskeletal negative musculoskeletal ROS (+)   Abdominal (+) + obese,   Peds negative pediatric ROS (+)  Hematology negative hematology ROS (+)   Anesthesia Other Findings Breast Cancer  Reproductive/Obstetrics negative OB ROS                             Anesthesia Physical Anesthesia Plan  ASA: III  Anesthesia Plan: General   Post-op Pain Management:  Regional for Post-op pain   Induction: Intravenous  PONV Risk Score and Plan: 3 and Ondansetron, Dexamethasone, Midazolam and Treatment may vary due to age or medical condition  Airway Management Planned: LMA  Additional Equipment:   Intra-op Plan:   Post-operative Plan: Extubation in OR  Informed Consent: I have reviewed the patients History and Physical, chart, labs and discussed the procedure including the risks, benefits and alternatives for the proposed anesthesia with the patient or authorized representative who has indicated his/her understanding and acceptance.     Dental advisory given  Plan Discussed with: CRNA  Anesthesia Plan Comments:         Anesthesia Quick Evaluation

## 2020-08-31 NOTE — Anesthesia Procedure Notes (Signed)
Procedure Name: LMA Insertion Date/Time: 08/31/2020 11:50 AM Performed by: Maryella Shivers, CRNA Pre-anesthesia Checklist: Patient identified, Emergency Drugs available, Suction available and Patient being monitored Patient Re-evaluated:Patient Re-evaluated prior to induction Oxygen Delivery Method: Circle system utilized Preoxygenation: Pre-oxygenation with 100% oxygen Induction Type: IV induction Ventilation: Mask ventilation without difficulty LMA: LMA inserted LMA Size: 4.0 Number of attempts: 1 Airway Equipment and Method: Bite block Placement Confirmation: positive ETCO2 Tube secured with: Tape Dental Injury: Teeth and Oropharynx as per pre-operative assessment

## 2020-08-31 NOTE — Anesthesia Postprocedure Evaluation (Signed)
Anesthesia Post Note  Patient: Gina Walsh  Procedure(s) Performed: RIGHT BREAST LUMPECTOMY WITH SENTINEL LYMPH NODE BX (Right Breast)     Patient location during evaluation: PACU Anesthesia Type: General Level of consciousness: awake and alert Pain management: pain level controlled Vital Signs Assessment: post-procedure vital signs reviewed and stable Respiratory status: spontaneous breathing, nonlabored ventilation and respiratory function stable Cardiovascular status: blood pressure returned to baseline and stable Postop Assessment: no apparent nausea or vomiting Anesthetic complications: no   No complications documented.  Last Vitals:  Vitals:   08/31/20 1315 08/31/20 1332  BP: 124/76 128/73  Pulse: 95 92  Resp: 12 14  Temp:    SpO2: 95% 91%    Last Pain:  Vitals:   08/31/20 1332  TempSrc:   PainSc: 0-No pain                 Lynda Rainwater

## 2020-08-31 NOTE — Anesthesia Procedure Notes (Signed)
Anesthesia Regional Block: Pectoralis block   Pre-Anesthetic Checklist: ,, timeout performed, Correct Patient, Correct Site, Correct Laterality, Correct Procedure, Correct Position, site marked, Risks and benefits discussed,  Surgical consent,  Pre-op evaluation,  At surgeon's request and post-op pain management  Laterality: Right  Prep: chloraprep       Needles:  Injection technique: Single-shot  Needle Type: Stimiplex     Needle Length: 9cm  Needle Gauge: 21     Additional Needles:   Procedures:,,,, ultrasound used (permanent image in chart),,,,  Narrative:  Start time: 08/31/2020 10:58 AM End time: 08/31/2020 11:03 AM Injection made incrementally with aspirations every 5 mL.  Performed by: Personally  Anesthesiologist: Lynda Rainwater, MD

## 2020-08-31 NOTE — Transfer of Care (Signed)
Immediate Anesthesia Transfer of Care Note  Patient: Gina Walsh  Procedure(s) Performed: RIGHT BREAST LUMPECTOMY WITH SENTINEL LYMPH NODE BX (Right Breast)  Patient Location: PACU  Anesthesia Type:General  Level of Consciousness: sedated  Airway & Oxygen Therapy: Patient Spontanous Breathing and Patient connected to face mask oxygen  Post-op Assessment: Report given to RN and Post -op Vital signs reviewed and stable  Post vital signs: Reviewed and stable  Last Vitals:  Vitals Value Taken Time  BP 118/69 08/31/20 1255  Temp    Pulse 86 08/31/20 1259  Resp 10 08/31/20 1259  SpO2 97 % 08/31/20 1259  Vitals shown include unvalidated device data.  Last Pain:  Vitals:   08/31/20 0957  TempSrc: Oral  PainSc: 0-No pain      Patients Stated Pain Goal: 4 (15/17/61 6073)  Complications: No complications documented.

## 2020-08-31 NOTE — Op Note (Signed)
08/31/2020  12:48 PM  PATIENT:  Gina Walsh  70 y.o. female  PRE-OPERATIVE DIAGNOSIS:  RIGHT BREAST CANCER  POST-OPERATIVE DIAGNOSIS:  RIGHT BREAST CANCER  PROCEDURE:  Procedure(s): RIGHT BREAST LUMPECTOMY WITH DEEP RIGHT AXILLARY SENTINEL LYMPH NODE BIOPSY  SURGEON:  Surgeon(s) and Role:    * Jovita Kussmaul, MD - Primary  PHYSICIAN ASSISTANT:   ASSISTANTS: none   ANESTHESIA:   local and general  EBL:  minimal   BLOOD ADMINISTERED:none  DRAINS: none   LOCAL MEDICATIONS USED:  MARCAINE     SPECIMEN:  Source of Specimen:  right breast tissue and sentinel nodes x 2  DISPOSITION OF SPECIMEN:  PATHOLOGY  COUNTS:  YES  TOURNIQUET:  * No tourniquets in log *  DICTATION: .Dragon Dictation   After informed consent was obtained the patient was brought to the operating room and placed in the supine position on the operating table.  After adequate induction of general anesthesia the patient's right chest, breast, and axillary area were prepped with ChloraPrep, allowed to dry, and draped in usual sterile manner.  An appropriate timeout was performed.  Earlier in the day the patient underwent injection of 1 mCi of technetium sulfur colloid in the subareolar position on the right.  The neoprobe was set to technetium and an area of radioactivity was readily identified in the right axilla.  The cancer was palpable in the upper outer quadrant of the right breast in the palpable cancer and the radioactivity were in very close proximity to each other.  Because of this I elected to make an elliptical incision in the skin overlying the palpable cancer with a 15 blade knife.  The incision was carried through the skin and subcutaneous tissue sharply with the electrocautery.  Dissection was then carried around the palpable mass in all the way to the chest wall.  Once the specimen was removed it was oriented with the appropriate paint colors.  A specimen radiograph was obtained that showed the clip  to be in the center of the specimen.  The specimen was then sent to pathology for further evaluation.  Using the neoprobe to direct dissection I then went through the lumpectomy cavity and entered the deep right axillary space using the electrocautery.  Blunt hemostatics dissection was then directed by the neoprobe.  I was able to identify 2 hot lymph nodes.  These were excised sharply with the electrocautery and the surrounding small vessels and lymphatics were controlled with clips.  Ex vivo counts on these 2 nodes were approximately 350.  No other hot or palpable nodes were identified in the right axilla.  Hemostasis was achieved using the Bovie electrocautery.  The deep layer of the incision was then closed with interrupted 3-0 Vicryl stitches.  The lumpectomy cavity was then irrigated with saline and infiltrated with more quarter percent Marcaine.  The cavity was also marked with clips.  The deep layer of the lumpectomy cavity was then closed with layers of interrupted 3-0 Vicryl stitches.  The skin was closed with a running 4-0 Monocryl subcuticular stitch.  Dermabond dressings were applied.  The patient tolerated the procedure well.  At the end of the case all needle sponge and instrument counts were correct.  The patient was then awakened and taken to recovery in stable condition.  PLAN OF CARE: Discharge to home after PACU  PATIENT DISPOSITION:  PACU - hemodynamically stable.   Delay start of Pharmacological VTE agent (>24hrs) due to surgical blood loss or risk of  bleeding: not applicable

## 2020-08-31 NOTE — Interval H&P Note (Signed)
History and Physical Interval Note:  08/31/2020 10:49 AM  Gina Walsh  has presented today for surgery, with the diagnosis of RIGHT BREAST CANCER.  The various methods of treatment have been discussed with the patient and family. After consideration of risks, benefits and other options for treatment, the patient has consented to  Procedure(s): RIGHT BREAST LUMPECTOMY WITH SENTINEL LYMPH NODE BX (Right) as a surgical intervention.  The patient's history has been reviewed, patient examined, no change in status, stable for surgery.  I have reviewed the patient's chart and labs.  Questions were answered to the patient's satisfaction.     [name]

## 2020-08-31 NOTE — Progress Notes (Signed)
Assisted Dr. Miller with right, ultrasound guided, pectoralis block. Side rails up, monitors on throughout procedure. See vital signs in flow sheet. Tolerated Procedure well. 

## 2020-09-01 ENCOUNTER — Encounter (HOSPITAL_BASED_OUTPATIENT_CLINIC_OR_DEPARTMENT_OTHER): Payer: Self-pay | Admitting: General Surgery

## 2020-09-04 LAB — SURGICAL PATHOLOGY

## 2020-09-05 ENCOUNTER — Telehealth: Payer: Self-pay | Admitting: *Deleted

## 2020-09-05 ENCOUNTER — Encounter: Payer: Self-pay | Admitting: *Deleted

## 2020-09-05 ENCOUNTER — Ambulatory Visit: Payer: Self-pay | Admitting: Genetic Counselor

## 2020-09-05 ENCOUNTER — Telehealth: Payer: Self-pay | Admitting: Genetic Counselor

## 2020-09-05 ENCOUNTER — Encounter: Payer: Self-pay | Admitting: Genetic Counselor

## 2020-09-05 DIAGNOSIS — Z170 Estrogen receptor positive status [ER+]: Secondary | ICD-10-CM

## 2020-09-05 DIAGNOSIS — Z1379 Encounter for other screening for genetic and chromosomal anomalies: Secondary | ICD-10-CM | POA: Insufficient documentation

## 2020-09-05 DIAGNOSIS — C50411 Malignant neoplasm of upper-outer quadrant of right female breast: Secondary | ICD-10-CM

## 2020-09-05 NOTE — Telephone Encounter (Signed)
Revealed negative genetic testing.  Discussed that we do not know why she has breast cancer or why there is cancer in the family. It could be due to a different gene that we are not testing, or maybe our current technology may not be able to pick something up.  It will be important for her to keep in contact with genetics to keep up with whether additional testing may be needed. 

## 2020-09-05 NOTE — Progress Notes (Signed)
HPI:  Ms. Constantin was previously seen in the [place] clinic due to a personal and family history of cancer and concerns regarding a hereditary predisposition to cancer. Please refer to our prior cancer genetics clinic note for more information regarding our discussion, assessment and recommendations, at the time. Ms. Gonzalo recent genetic test results were disclosed to her, as were recommendations warranted by these results. These results and recommendations are discussed in more detail below.  CANCER HISTORY:  Oncology History  Malignant neoplasm of upper-outer quadrant of right breast in female, estrogen receptor positive ([place])  1997 Pathology Results   History of left breast DCIS status postlumpectomy (Dr. Margot Chimes) and radiation, did not receive antiestrogen therapy   08/09/2020 Initial Diagnosis   Patient palpated a right breast mass which was 1.9 cm by mammogram at 11:30 position right breast, axilla negative, biopsy revealed grade 2-3 IDC ER 70%, PR 50%, Ki-67 15%, HER2 negative   09/04/2020 Genetic Testing   Negative genetic testing on the CancerNext-Expanded+RNAinsight panel.  The CancerNext-Expanded gene panel offered by [facility] and includes sequencing and rearrangement analysis for the following 77 genes: AIP, ALK, APC*, ATM*, AXIN2, BAP1, BARD1, BLM, BMPR1A, BRCA1*, BRCA2*, BRIP1*, CDC73, CDH1*, CDK4, CDKN1B, CDKN2A, CHEK2*, CTNNA1, DICER1, FANCC, FH, FLCN, GALNT12, KIF1B, LZTR1, MAX, MEN1, MET, MLH1*, MSH2*, MSH3, MSH6*, MUTYH*, NBN, NF1*, NF2, NTHL1, PALB2*, PHOX2B, PMS2*, POT1, PRKAR1A, PTCH1, PTEN*, RAD51C*, RAD51D*, RB1, RECQL, RET, SDHA, SDHAF2, SDHB, SDHC, SDHD, SMAD4, SMARCA4, SMARCB1, SMARCE1, STK11, SUFU, TMEM127, TP53*, TSC1, TSC2, VHL and XRCC2 (sequencing and deletion/duplication); EGFR, EGLN1, HOXB13, KIT, MITF, PDGFRA, POLD1, and POLE (sequencing only); EPCAM and GREM1 (deletion/duplication only). DNA and RNA analyses performed for * genes. The  report date is September 04, 2020     FAMILY HISTORY:  We obtained a detailed, 4-generation family history.  Significant diagnoses are listed below: Family History  Problem Relation Age of Onset  . Breast cancer Sister 55  . Prostate cancer Father 27       d. 75  . Congestive Heart Failure Mother   . Stroke Maternal Grandmother 48       d. 35  . Lung disease Maternal Grandfather        ? Brown lung  . Bowel Disease Paternal Grandmother   . Breast cancer Cousin 34       pat first cousin's daughter  . Colon cancer Neg Hx   . Esophageal cancer Neg Hx   . Rectal cancer Neg Hx   . Stomach cancer Neg Hx     The patient has one daughter who is cancer free.  She has a sister who was diagnosed with breast cancer at 83 and is negative for BRCA mutations as of Jun 08, 2013.  Both parents are deceased.  The patient's mother died of CHF at 47.  She was an only child.  Her parents are deceased from non-cancer related issues.  The patient's father was diagnosed with prostate cancer at 61 and died of it at 14.  He had two sisters and a brother who were all cancer free, however one sister had a granddaughter who had breast cancer at 64.  The paternal grandparents died of non-cancer related issues.  Ms. Tennant is unaware of previous family history of genetic testing for hereditary cancer risks. Patient's maternal ancestors are of [place] descent, and paternal ancestors are of [place] descent. There is no reported Ashkenazi Jewish ancestry. There is no known consanguinity.  GENETIC TEST RESULTS: Genetic testing reported out on September 04, 2020 through the CancerNext-Expanded+RNAinsight cancer panel found no pathogenic mutations. The CancerNext-Expanded gene panel offered by [facility] and includes sequencing and rearrangement analysis for the following 77 genes: AIP, ALK, APC*, ATM*, AXIN2, BAP1, BARD1, BLM, BMPR1A, BRCA1*, BRCA2*, BRIP1*, CDC73, CDH1*, CDK4, CDKN1B, CDKN2A, CHEK2*, CTNNA1, DICER1, FANCC,  FH, FLCN, GALNT12, KIF1B, LZTR1, MAX, MEN1, MET, MLH1*, MSH2*, MSH3, MSH6*, MUTYH*, NBN, NF1*, NF2, NTHL1, PALB2*, PHOX2B, PMS2*, POT1, PRKAR1A, PTCH1, PTEN*, RAD51C*, RAD51D*, RB1, RECQL, RET, SDHA, SDHAF2, SDHB, SDHC, SDHD, SMAD4, SMARCA4, SMARCB1, SMARCE1, STK11, SUFU, TMEM127, TP53*, TSC1, TSC2, VHL and XRCC2 (sequencing and deletion/duplication); EGFR, EGLN1, HOXB13, KIT, MITF, PDGFRA, POLD1, and POLE (sequencing only); EPCAM and GREM1 (deletion/duplication only). DNA and RNA analyses performed for * genes. The test report has been scanned into EPIC and is located under the Molecular Pathology section of the Results Review tab.  A portion of the result report is included below for reference.     We discussed with Ms. Eriksen that because current genetic testing is not perfect, it is possible there may be a gene mutation in one of these genes that current testing cannot detect, but that chance is small.  We also discussed, that there could be another gene that has not yet been discovered, or that we have not yet tested, that is responsible for the cancer diagnoses in the family. It is also possible there is a hereditary cause for the cancer in the family that Ms. Banks did not inherit and therefore was not identified in her testing.  Therefore, it is important to remain in touch with cancer genetics in the future so that we can continue to offer Ms. Kole the most up to date genetic testing.   ADDITIONAL GENETIC TESTING: We discussed with Ms. Adamski that her genetic testing was fairly extensive.  If there are genes identified to increase cancer risk that can be analyzed in the future, we would be happy to discuss and coordinate this testing at that time.    CANCER SCREENING RECOMMENDATIONS: Ms. Hazell test result is considered negative (normal).  This means that we have not identified a hereditary cause for her personal and family history of cancer at this time. Most cancers happen by  chance and this negative test suggests that her cancer may fall into this category.    While reassuring, this does not definitively rule out a hereditary predisposition to cancer. It is still possible that there could be genetic mutations that are undetectable by current technology. There could be genetic mutations in genes that have not been tested or identified to increase cancer risk.  Therefore, it is recommended she continue to follow the cancer management and screening guidelines provided by her oncology and primary healthcare provider.   An individual's cancer risk and medical management are not determined by genetic test results alone. Overall cancer risk assessment incorporates additional factors, including personal medical history, family history, and any available genetic information that may result in a personalized plan for cancer prevention and surveillance  RECOMMENDATIONS FOR FAMILY MEMBERS:  Individuals in this family might be at some increased risk of developing cancer, over the general population risk, simply due to the family history of cancer.  We recommended women in this family have a yearly mammogram beginning at age 4, or 58 years younger than the earliest onset of cancer, an annual clinical breast exam, and perform monthly breast self-exams. Women in this family should also have a gynecological exam as recommended by their primary provider. All family  members should be referred for colonoscopy starting at age 78.  FOLLOW-UP: Lastly, we discussed with Ms. Jimenez that cancer genetics is a rapidly advancing field and it is possible that new genetic tests will be appropriate for her and/or her family members in the future. We encouraged her to remain in contact with cancer genetics on an annual basis so we can update her personal and family histories and let her know of advances in cancer genetics that may benefit this family.   Our contact number was provided. Ms. Cantave  questions were answered to her satisfaction, and she knows she is welcome to call us at anytime with additional questions or concerns.   Roma Kayser, [place], [facility] Licensed, Certified Genetic Counselor [email or web address]

## 2020-09-05 NOTE — Telephone Encounter (Signed)
Received order for oncotype testing. Requisition faxed to pathology and [facility] °

## 2020-09-12 NOTE — Assessment & Plan Note (Signed)
08/31/2020:Right lumpectomy Gina Walsh): IDC, grade 2, 1.8cm, clear margins, 2 lymph nodes negative for carcinoma.  ER 70%, PR 50%, Ki67 15%, HER2 negative  Pathology counseling: I discussed the final pathology report of the patient provided  a copy of this report. I discussed the margins.  We also discussed the final staging along with previously performed ER/PR testing.  Treatment plan: 1. Oncotype DX testing to determine if chemotherapy would be of any benefit followed by 2. Adjuvant radiation therapy followed by 3. Adjuvant antiestrogen therapy  Patient went for a beach vacation that day after surgery and spent a week.  Return to clinic based upon Oncotype DX test result

## 2020-09-12 NOTE — Progress Notes (Signed)
HEMATOLOGY-ONCOLOGY MYCHART VIDEO VISIT PROGRESS NOTE  I connected with Gina Walsh on 09/13/2020 at 10:30 AM EDT by MyChart video conference and verified that I am speaking with the correct person using two identifiers.  I discussed the limitations, risks, security and privacy concerns of performing an evaluation and management service by MyChart and the availability of in person appointments.  I also discussed with the patient that there may be a patient responsible charge related to this service. The patient expressed understanding and agreed to proceed.  Patient's Location: Home Physician Location: Clinic  CHIEF COMPLIANT: Follow-up s/p lumpectomy   INTERVAL HISTORY: Gina Walsh is a 70 y.o. female with above-mentioned history of right breast cancer. She underwent a right lumpectomy on 08/31/20 with Dr. [name] for which pathology showed invasive ductal carcinoma, grade 2, 1.8cm, clear margins, 2 lymph nodes negative for carcinoma. She presents over MyChart today for follow-up.  She has had no pain or discomfort since surgery.  She is doing quite well.  Oncology History  Malignant neoplasm of upper-outer quadrant of right breast in female, estrogen receptor positive ([place])  1997 Pathology Results   History of left breast DCIS status postlumpectomy (Dr. Margot Chimes) and radiation, did not receive antiestrogen therapy   08/09/2020 Initial Diagnosis   Patient palpated a right breast mass which was 1.9 cm by mammogram at 11:30 position right breast, axilla negative, biopsy revealed grade 2-3 IDC ER 70%, PR 50%, Ki-67 15%, HER2 negative   08/31/2020 Surgery   Right lumpectomy [name]): IDC, grade 2, 1.8cm, clear margins, 2 lymph nodes negative for carcinoma.   09/04/2020 Genetic Testing   Negative genetic testing on the CancerNext-Expanded+RNAinsight panel.  The CancerNext-Expanded gene panel offered by [facility] and includes sequencing and rearrangement analysis for the following 77 genes:  AIP, ALK, APC*, ATM*, AXIN2, BAP1, BARD1, BLM, BMPR1A, BRCA1*, BRCA2*, BRIP1*, CDC73, CDH1*, CDK4, CDKN1B, CDKN2A, CHEK2*, CTNNA1, DICER1, FANCC, FH, FLCN, GALNT12, KIF1B, LZTR1, MAX, MEN1, MET, MLH1*, MSH2*, MSH3, MSH6*, MUTYH*, NBN, NF1*, NF2, NTHL1, PALB2*, PHOX2B, PMS2*, POT1, PRKAR1A, PTCH1, PTEN*, RAD51C*, RAD51D*, RB1, RECQL, RET, SDHA, SDHAF2, SDHB, SDHC, SDHD, SMAD4, SMARCA4, SMARCB1, SMARCE1, STK11, SUFU, TMEM127, TP53*, TSC1, TSC2, VHL and XRCC2 (sequencing and deletion/duplication); EGFR, EGLN1, HOXB13, KIT, MITF, PDGFRA, POLD1, and POLE (sequencing only); EPCAM and GREM1 (deletion/duplication only). DNA and RNA analyses performed for * genes. The report date is September 04, 2020        Assessment Plan:  Malignant neoplasm of upper-outer quadrant of right breast in female, estrogen receptor positive ([place]) 08/31/2020:Right lumpectomy [name]): IDC, grade 2, 1.8cm, clear margins, 2 lymph nodes negative for carcinoma.  ER 70%, PR 50%, Ki67 15%, HER2 negative  Pathology counseling: I discussed the final pathology report of the patient provided  a copy of this report. I discussed the margins.  We also discussed the final staging along with previously performed ER/PR testing.  Treatment plan: 1. Oncotype DX testing to determine if chemotherapy would be of any benefit followed by 2. Adjuvant radiation therapy followed by 3. Adjuvant antiestrogen therapy  Return to clinic based upon Oncotype DX test result  I discussed the assessment and treatment plan with the patient. The patient was provided an opportunity to ask questions and all were answered. The patient agreed with the plan and demonstrated an understanding of the instructions. The patient was advised to call back or seek an in-person evaluation if the symptoms worsen or if the condition fails to improve as anticipated.   Total time spent: 30 minutes  including face-to-face MyChart video visit time and time spent for planning, charting and  coordination of care  Rulon Eisenmenger, MD 09/13/2020   I, Cloyde Reams Dorshimer, am acting as scribe for Nicholas Lose, MD.  I have reviewed the above documentation for accuracy and completeness, and I agree with the above.

## 2020-09-13 ENCOUNTER — Inpatient Hospital Stay: Payer: PPO | Attending: Hematology and Oncology | Admitting: Hematology and Oncology

## 2020-09-13 DIAGNOSIS — C50411 Malignant neoplasm of upper-outer quadrant of right female breast: Secondary | ICD-10-CM

## 2020-09-13 DIAGNOSIS — Z170 Estrogen receptor positive status [ER+]: Secondary | ICD-10-CM

## 2020-09-14 ENCOUNTER — Telehealth: Payer: Self-pay | Admitting: Radiation Oncology

## 2020-09-14 NOTE — Telephone Encounter (Signed)
I called the patient to let her know we were also waiting on her oncotype results and can plan still to do simulation. She feels like she's healing well at this time.

## 2020-09-18 DIAGNOSIS — H43813 Vitreous degeneration, bilateral: Secondary | ICD-10-CM | POA: Diagnosis not present

## 2020-09-18 DIAGNOSIS — H40013 Open angle with borderline findings, low risk, bilateral: Secondary | ICD-10-CM | POA: Diagnosis not present

## 2020-09-18 DIAGNOSIS — H2513 Age-related nuclear cataract, bilateral: Secondary | ICD-10-CM | POA: Diagnosis not present

## 2020-09-18 DIAGNOSIS — D3131 Benign neoplasm of right choroid: Secondary | ICD-10-CM | POA: Diagnosis not present

## 2020-09-19 ENCOUNTER — Other Ambulatory Visit: Payer: Self-pay

## 2020-09-19 ENCOUNTER — Ambulatory Visit
Admission: RE | Admit: 2020-09-19 | Discharge: 2020-09-19 | Disposition: A | Discharge status: Home / Self Care | Payer: PPO | Source: Ambulatory Visit | Attending: Radiation Oncology | Admitting: Radiation Oncology

## 2020-09-19 DIAGNOSIS — Z170 Estrogen receptor positive status [ER+]: Secondary | ICD-10-CM | POA: Insufficient documentation

## 2020-09-19 DIAGNOSIS — Z510 Encounter for antineoplastic radiation therapy: Secondary | ICD-10-CM | POA: Diagnosis not present

## 2020-09-19 DIAGNOSIS — C50411 Malignant neoplasm of upper-outer quadrant of right female breast: Secondary | ICD-10-CM | POA: Diagnosis not present

## 2020-09-22 DIAGNOSIS — Z510 Encounter for antineoplastic radiation therapy: Secondary | ICD-10-CM | POA: Diagnosis not present

## 2020-09-22 DIAGNOSIS — C50411 Malignant neoplasm of upper-outer quadrant of right female breast: Secondary | ICD-10-CM | POA: Diagnosis not present

## 2020-09-25 ENCOUNTER — Ambulatory Visit
Admission: RE | Admit: 2020-09-25 | Discharge: 2020-09-25 | Disposition: A | Discharge status: Home / Self Care | Payer: PPO | Source: Ambulatory Visit | Attending: Radiation Oncology | Admitting: Radiation Oncology

## 2020-09-25 ENCOUNTER — Other Ambulatory Visit: Payer: Self-pay

## 2020-09-25 ENCOUNTER — Telehealth: Payer: Self-pay | Admitting: *Deleted

## 2020-09-25 ENCOUNTER — Telehealth: Payer: Self-pay | Admitting: Radiation Oncology

## 2020-09-25 DIAGNOSIS — Z510 Encounter for antineoplastic radiation therapy: Secondary | ICD-10-CM | POA: Diagnosis not present

## 2020-09-25 DIAGNOSIS — C50411 Malignant neoplasm of upper-outer quadrant of right female breast: Secondary | ICD-10-CM | POA: Diagnosis not present

## 2020-09-25 NOTE — Telephone Encounter (Signed)
I communicated with Dr. Lindi Adie and he's comfortable moving forward with xrt without oncotype dx.  She will start treatment today.

## 2020-09-25 NOTE — Telephone Encounter (Signed)
Spoke with the patient to let her know that we are still waiting for her oncotype score.  We will give her a call with the results as soon as we have them.  If the score is not back by her treatment time we will push her treatment start date out.  She verbalized understanding.    Gina Walsh. Leonie Green, BSN

## 2020-09-26 ENCOUNTER — Telehealth: Payer: Self-pay | Admitting: Radiation Oncology

## 2020-09-26 ENCOUNTER — Ambulatory Visit
Admission: RE | Admit: 2020-09-26 | Discharge: 2020-09-26 | Disposition: A | Discharge status: Home / Self Care | Payer: PPO | Source: Ambulatory Visit | Attending: Radiation Oncology | Admitting: Radiation Oncology

## 2020-09-26 DIAGNOSIS — Z510 Encounter for antineoplastic radiation therapy: Secondary | ICD-10-CM | POA: Diagnosis not present

## 2020-09-26 DIAGNOSIS — C50411 Malignant neoplasm of upper-outer quadrant of right female breast: Secondary | ICD-10-CM | POA: Diagnosis not present

## 2020-09-26 NOTE — Telephone Encounter (Signed)
Opened in error

## 2020-09-27 ENCOUNTER — Ambulatory Visit: Payer: PPO | Admitting: Radiation Oncology

## 2020-09-27 ENCOUNTER — Other Ambulatory Visit: Payer: Self-pay

## 2020-09-27 ENCOUNTER — Ambulatory Visit
Admission: RE | Admit: 2020-09-27 | Discharge: 2020-09-27 | Disposition: A | Discharge status: Home / Self Care | Payer: PPO | Source: Ambulatory Visit | Attending: Radiation Oncology | Admitting: Radiation Oncology

## 2020-09-27 DIAGNOSIS — C50411 Malignant neoplasm of upper-outer quadrant of right female breast: Secondary | ICD-10-CM | POA: Diagnosis not present

## 2020-09-27 DIAGNOSIS — Z510 Encounter for antineoplastic radiation therapy: Secondary | ICD-10-CM | POA: Diagnosis not present

## 2020-09-28 ENCOUNTER — Ambulatory Visit
Admission: RE | Admit: 2020-09-28 | Discharge: 2020-09-28 | Disposition: A | Discharge status: Home / Self Care | Payer: PPO | Source: Ambulatory Visit | Attending: Radiation Oncology | Admitting: Radiation Oncology

## 2020-09-28 DIAGNOSIS — C50411 Malignant neoplasm of upper-outer quadrant of right female breast: Secondary | ICD-10-CM | POA: Diagnosis not present

## 2020-09-28 DIAGNOSIS — Z510 Encounter for antineoplastic radiation therapy: Secondary | ICD-10-CM | POA: Diagnosis not present

## 2020-09-28 NOTE — Progress Notes (Signed)

## 2020-09-29 ENCOUNTER — Telehealth: Payer: Self-pay | Admitting: Hematology and Oncology

## 2020-09-29 ENCOUNTER — Ambulatory Visit
Admission: RE | Admit: 2020-09-29 | Discharge: 2020-09-29 | Disposition: A | Discharge status: Home / Self Care | Payer: PPO | Source: Ambulatory Visit | Attending: Radiation Oncology | Admitting: Radiation Oncology

## 2020-09-29 ENCOUNTER — Encounter: Payer: Self-pay | Admitting: *Deleted

## 2020-09-29 ENCOUNTER — Other Ambulatory Visit: Payer: Self-pay

## 2020-09-29 DIAGNOSIS — C50411 Malignant neoplasm of upper-outer quadrant of right female breast: Secondary | ICD-10-CM | POA: Insufficient documentation

## 2020-09-29 DIAGNOSIS — Z170 Estrogen receptor positive status [ER+]: Secondary | ICD-10-CM | POA: Insufficient documentation

## 2020-09-29 MED ORDER — ALRA NON-METALLIC DEODORANT (RAD-ONC)
1.0000 | Freq: Once | TOPICAL | Status: AC
Start: 2020-09-29 — End: 2020-09-29
  Administered 2020-09-29: 1 via TOPICAL

## 2020-09-29 MED ORDER — RADIAPLEXRX EX GEL: Freq: Once | CUTANEOUS | Status: AC

## 2020-09-29 NOTE — Telephone Encounter (Signed)
Scheduled appt per 7/1 sch msg. Pt aware.

## 2020-10-03 ENCOUNTER — Ambulatory Visit
Admission: RE | Admit: 2020-10-03 | Discharge: 2020-10-03 | Disposition: A | Discharge status: Home / Self Care | Payer: PPO | Source: Ambulatory Visit | Attending: Radiation Oncology | Admitting: Radiation Oncology

## 2020-10-03 ENCOUNTER — Other Ambulatory Visit: Payer: Self-pay

## 2020-10-03 DIAGNOSIS — C50411 Malignant neoplasm of upper-outer quadrant of right female breast: Secondary | ICD-10-CM | POA: Diagnosis not present

## 2020-10-04 ENCOUNTER — Ambulatory Visit
Admission: RE | Admit: 2020-10-04 | Discharge: 2020-10-04 | Disposition: A | Discharge status: Home / Self Care | Payer: PPO | Source: Ambulatory Visit | Attending: Radiation Oncology | Admitting: Radiation Oncology

## 2020-10-04 DIAGNOSIS — C50411 Malignant neoplasm of upper-outer quadrant of right female breast: Secondary | ICD-10-CM | POA: Diagnosis not present

## 2020-10-05 ENCOUNTER — Telehealth: Payer: Self-pay | Admitting: *Deleted

## 2020-10-05 ENCOUNTER — Encounter: Payer: Self-pay | Admitting: *Deleted

## 2020-10-05 ENCOUNTER — Other Ambulatory Visit: Payer: Self-pay

## 2020-10-05 ENCOUNTER — Ambulatory Visit
Admission: RE | Admit: 2020-10-05 | Discharge: 2020-10-05 | Disposition: A | Discharge status: Home / Self Care | Payer: PPO | Source: Ambulatory Visit | Attending: Radiation Oncology | Admitting: Radiation Oncology

## 2020-10-05 DIAGNOSIS — C50411 Malignant neoplasm of upper-outer quadrant of right female breast: Secondary | ICD-10-CM | POA: Diagnosis not present

## 2020-10-05 NOTE — Telephone Encounter (Signed)
Public librarian for update on authorization from [employer] for testing results. Informed will have decision on authorization on 10/06/20 and report will be released once authorization has been received.

## 2020-10-06 ENCOUNTER — Ambulatory Visit
Admission: RE | Admit: 2020-10-06 | Discharge: 2020-10-06 | Disposition: A | Discharge status: Home / Self Care | Payer: PPO | Source: Ambulatory Visit | Attending: Radiation Oncology | Admitting: Radiation Oncology

## 2020-10-06 DIAGNOSIS — Z170 Estrogen receptor positive status [ER+]: Secondary | ICD-10-CM | POA: Diagnosis not present

## 2020-10-06 DIAGNOSIS — C50411 Malignant neoplasm of upper-outer quadrant of right female breast: Secondary | ICD-10-CM | POA: Diagnosis not present

## 2020-10-09 ENCOUNTER — Encounter: Payer: Self-pay | Admitting: *Deleted

## 2020-10-09 ENCOUNTER — Telehealth: Payer: Self-pay | Admitting: *Deleted

## 2020-10-09 ENCOUNTER — Ambulatory Visit
Admission: RE | Admit: 2020-10-09 | Discharge: 2020-10-09 | Disposition: A | Discharge status: Home / Self Care | Payer: PPO | Source: Ambulatory Visit | Attending: Radiation Oncology | Admitting: Radiation Oncology

## 2020-10-09 ENCOUNTER — Other Ambulatory Visit: Payer: Self-pay

## 2020-10-09 DIAGNOSIS — C50411 Malignant neoplasm of upper-outer quadrant of right female breast: Secondary | ICD-10-CM | POA: Diagnosis not present

## 2020-10-09 NOTE — Telephone Encounter (Signed)
Received oncotype score of 20. Physician team notified. Left vm on verified vm with score and discussed continue with xrt followed by AI after xrt complete. Contact information provided for questions or needs.

## 2020-10-10 ENCOUNTER — Other Ambulatory Visit: Payer: Self-pay

## 2020-10-10 ENCOUNTER — Ambulatory Visit
Admission: RE | Admit: 2020-10-10 | Discharge: 2020-10-10 | Disposition: A | Discharge status: Home / Self Care | Payer: PPO | Source: Ambulatory Visit | Attending: Radiation Oncology | Admitting: Radiation Oncology

## 2020-10-10 DIAGNOSIS — C50411 Malignant neoplasm of upper-outer quadrant of right female breast: Secondary | ICD-10-CM | POA: Diagnosis not present

## 2020-10-11 ENCOUNTER — Ambulatory Visit
Admission: RE | Admit: 2020-10-11 | Discharge: 2020-10-11 | Disposition: A | Discharge status: Home / Self Care | Payer: PPO | Source: Ambulatory Visit | Attending: Radiation Oncology | Admitting: Radiation Oncology

## 2020-10-11 DIAGNOSIS — C50411 Malignant neoplasm of upper-outer quadrant of right female breast: Secondary | ICD-10-CM | POA: Diagnosis not present

## 2020-10-12 ENCOUNTER — Ambulatory Visit
Admission: RE | Admit: 2020-10-12 | Discharge: 2020-10-12 | Disposition: A | Discharge status: Home / Self Care | Payer: PPO | Source: Ambulatory Visit | Attending: Radiation Oncology | Admitting: Radiation Oncology

## 2020-10-12 ENCOUNTER — Encounter: Payer: Self-pay | Admitting: Radiation Oncology

## 2020-10-12 ENCOUNTER — Other Ambulatory Visit: Payer: Self-pay

## 2020-10-12 DIAGNOSIS — C50411 Malignant neoplasm of upper-outer quadrant of right female breast: Secondary | ICD-10-CM | POA: Diagnosis not present

## 2020-10-13 ENCOUNTER — Ambulatory Visit
Admission: RE | Admit: 2020-10-13 | Discharge: 2020-10-13 | Disposition: A | Discharge status: Home / Self Care | Payer: PPO | Source: Ambulatory Visit | Attending: Radiation Oncology | Admitting: Radiation Oncology

## 2020-10-13 ENCOUNTER — Ambulatory Visit: Payer: PPO | Admitting: Radiation Oncology

## 2020-10-13 ENCOUNTER — Other Ambulatory Visit: Payer: Self-pay

## 2020-10-13 DIAGNOSIS — C50411 Malignant neoplasm of upper-outer quadrant of right female breast: Secondary | ICD-10-CM | POA: Diagnosis not present

## 2020-10-16 ENCOUNTER — Ambulatory Visit
Admission: RE | Admit: 2020-10-16 | Discharge: 2020-10-16 | Disposition: A | Discharge status: Home / Self Care | Payer: PPO | Source: Ambulatory Visit | Attending: Radiation Oncology | Admitting: Radiation Oncology

## 2020-10-16 DIAGNOSIS — C50411 Malignant neoplasm of upper-outer quadrant of right female breast: Secondary | ICD-10-CM | POA: Diagnosis not present

## 2020-10-17 ENCOUNTER — Ambulatory Visit: Payer: PPO | Admitting: Radiation Oncology

## 2020-10-17 ENCOUNTER — Ambulatory Visit: Payer: PPO

## 2020-10-17 ENCOUNTER — Ambulatory Visit
Admission: RE | Admit: 2020-10-17 | Discharge: 2020-10-17 | Disposition: A | Discharge status: Home / Self Care | Payer: PPO | Source: Ambulatory Visit | Attending: Radiation Oncology | Admitting: Radiation Oncology

## 2020-10-17 DIAGNOSIS — C50411 Malignant neoplasm of upper-outer quadrant of right female breast: Secondary | ICD-10-CM | POA: Diagnosis not present

## 2020-10-18 ENCOUNTER — Ambulatory Visit: Payer: PPO

## 2020-10-18 ENCOUNTER — Other Ambulatory Visit: Payer: Self-pay

## 2020-10-18 ENCOUNTER — Ambulatory Visit
Admission: RE | Admit: 2020-10-18 | Discharge: 2020-10-18 | Disposition: A | Discharge status: Home / Self Care | Payer: PPO | Source: Ambulatory Visit | Attending: Radiation Oncology | Admitting: Radiation Oncology

## 2020-10-18 ENCOUNTER — Ambulatory Visit: Payer: PPO | Admitting: Radiation Oncology

## 2020-10-18 ENCOUNTER — Encounter: Payer: Self-pay | Admitting: *Deleted

## 2020-10-18 DIAGNOSIS — C50411 Malignant neoplasm of upper-outer quadrant of right female breast: Secondary | ICD-10-CM | POA: Diagnosis not present

## 2020-10-19 ENCOUNTER — Ambulatory Visit
Admission: RE | Admit: 2020-10-19 | Discharge: 2020-10-19 | Disposition: A | Discharge status: Home / Self Care | Payer: PPO | Source: Ambulatory Visit | Attending: Radiation Oncology | Admitting: Radiation Oncology

## 2020-10-19 ENCOUNTER — Ambulatory Visit: Payer: PPO

## 2020-10-19 DIAGNOSIS — C50411 Malignant neoplasm of upper-outer quadrant of right female breast: Secondary | ICD-10-CM | POA: Diagnosis not present

## 2020-10-19 NOTE — Progress Notes (Signed)
Patient Care Team: Merrilee Seashore, MD as PCP - General (Internal Medicine)  DIAGNOSIS:    ICD-10-CM   1. Malignant neoplasm of upper-outer quadrant of right breast in female, estrogen receptor positive ([place])  C50.411    Z17.0       SUMMARY OF ONCOLOGIC HISTORY: Oncology History  Malignant neoplasm of upper-outer quadrant of right breast in female, estrogen receptor positive ([place])  1997 Pathology Results   History of left breast DCIS status postlumpectomy (Dr. Margot Chimes) and radiation, did not receive antiestrogen therapy   08/09/2020 Initial Diagnosis   Patient palpated a right breast mass which was 1.9 cm by mammogram at 11:30 position right breast, axilla negative, biopsy revealed grade 2-3 IDC ER 70%, PR 50%, Ki-67 15%, HER2 negative   08/24/2020 Cancer Staging   Staging form: Breast, AJCC 8th Edition - Clinical: Stage IA (cT1c, cN0, cM0, G3, ER+, PR+, HER2-, Oncotype DX score: 20) - Signed by Nicholas Lose, MD on 10/20/2020  Stage prefix: Initial diagnosis  Multigene prognostic tests performed: Oncotype DX  Recurrence score range: Greater than or equal to 11  Histologic grading system: 3 grade system    08/31/2020 Surgery   Right lumpectomy [name]): IDC, grade 2, 1.8cm, clear margins, 2 lymph nodes negative for carcinoma.   09/04/2020 Genetic Testing   Negative genetic testing on the CancerNext-Expanded+RNAinsight panel.  The CancerNext-Expanded gene panel offered by [facility] and includes sequencing and rearrangement analysis for the following 77 genes: AIP, ALK, APC*, ATM*, AXIN2, BAP1, BARD1, BLM, BMPR1A, BRCA1*, BRCA2*, BRIP1*, CDC73, CDH1*, CDK4, CDKN1B, CDKN2A, CHEK2*, CTNNA1, DICER1, FANCC, FH, FLCN, GALNT12, KIF1B, LZTR1, MAX, MEN1, MET, MLH1*, MSH2*, MSH3, MSH6*, MUTYH*, NBN, NF1*, NF2, NTHL1, PALB2*, PHOX2B, PMS2*, POT1, PRKAR1A, PTCH1, PTEN*, RAD51C*, RAD51D*, RB1, RECQL, RET, SDHA, SDHAF2, SDHB, SDHC, SDHD, SMAD4, SMARCA4, SMARCB1, SMARCE1, STK11, SUFU, TMEM127,  TP53*, TSC1, TSC2, VHL and XRCC2 (sequencing and deletion/duplication); EGFR, EGLN1, HOXB13, KIT, MITF, PDGFRA, POLD1, and POLE (sequencing only); EPCAM and GREM1 (deletion/duplication only). DNA and RNA analyses performed for * genes. The report date is September 04, 2020   09/26/2020 - 10/20/2020 Radiation Therapy   Adjuvant radiation     CHIEF COMPLIANT: Follow-up of right breast cancer  INTERVAL HISTORY: Gina Walsh is a 70 y.o. with above-mentioned history of right breast cancer having undergone a lumpectomy, currently on radiation therapy. She reports to the clinic today for follow-up.  Mild radiation of tenderness but otherwise doing quite well.  ALLERGIES:  is allergic to sulfa antibiotics.  MEDICATIONS:  Current Outpatient Medications  Medication Sig Dispense Refill   letrozole (FEMARA) 2.5 MG tablet Take 1 tablet (2.5 mg total) by mouth daily. 90 tablet 3   albuterol (VENTOLIN HFA) 108 (90 Base) MCG/ACT inhaler 2 puffs as needed     atorvastatin (LIPITOR) 40 MG tablet Take 40 mg by mouth daily.     Calcium Carbonate Antacid (TUMS E-X 750 PO) Take 2 tablets by mouth daily.     escitalopram (LEXAPRO) 20 MG tablet Take 20 mg by mouth daily.     traZODone (DESYREL) 50 MG tablet Take 50 mg by mouth at bedtime.     No current facility-administered medications for this visit.    PHYSICAL EXAMINATION: ECOG PERFORMANCE STATUS: 1 - Symptomatic but completely ambulatory  Vitals:   10/20/20 1352  BP: 125/75  Pulse: 79  Resp: 16  Temp: 97.6 F (36.4 C)  SpO2: 96%   Filed Weights   10/20/20 1352  Weight: 225 lb (102.1 kg)  LABORATORY DATA:  I have reviewed the data as listed No flowsheet data found.  No results found for: WBC, HGB, HCT, MCV, PLT, NEUTROABS  ASSESSMENT & PLAN:  Malignant neoplasm of upper-outer quadrant of right breast in female, estrogen receptor positive ([place]) 08/31/2020:Right lumpectomy [name]): IDC, grade 2, 1.8cm, clear margins, 2 lymph nodes  negative for carcinoma.  ER 70%, PR 50%, Ki67 15%, HER2 negative Oncotype DX testing: Recurrence score: 20  Treatment plan: 1. Adjuvant radiation therapy completed 10/20/2020 3. Adjuvant antiestrogen therapy to start 10/30/2020  Letrozole counseling: We discussed the risks and benefits of anti-estrogen therapy with aromatase inhibitors. These include but not limited to insomnia, hot flashes, mood changes, vaginal dryness, bone density loss, and weight gain. We strongly believe that the benefits far outweigh the risks. Patient understands these risks and consented to starting treatment. Planned treatment duration is 7 years.    Return to clinic in 3 months for survivorship care plan visit    No orders of the defined types were placed in this encounter.  The patient has a good understanding of the overall plan. she agrees with it. she will call with any problems that may develop before the next visit here.  Total time spent: 20 mins including face to face time and time spent for planning, charting and coordination of care  Rulon Eisenmenger, MD, MPH 10/20/2020  I, Thana Ates, am acting as scribe for Dr. Nicholas Lose.  I have reviewed the above documentation for accuracy and completeness, and I agree with the above.

## 2020-10-20 ENCOUNTER — Ambulatory Visit
Admission: RE | Admit: 2020-10-20 | Discharge: 2020-10-20 | Disposition: A | Discharge status: Home / Self Care | Payer: PPO | Source: Ambulatory Visit | Attending: Radiation Oncology | Admitting: Radiation Oncology

## 2020-10-20 ENCOUNTER — Ambulatory Visit: Payer: PPO | Admitting: Radiation Oncology

## 2020-10-20 ENCOUNTER — Other Ambulatory Visit: Payer: Self-pay

## 2020-10-20 ENCOUNTER — Inpatient Hospital Stay: Payer: PPO | Attending: Hematology and Oncology | Admitting: Hematology and Oncology

## 2020-10-20 ENCOUNTER — Ambulatory Visit: Payer: PPO

## 2020-10-20 ENCOUNTER — Encounter: Payer: Self-pay | Admitting: Radiation Oncology

## 2020-10-20 DIAGNOSIS — L598 Other specified disorders of the skin and subcutaneous tissue related to radiation: Secondary | ICD-10-CM | POA: Diagnosis not present

## 2020-10-20 DIAGNOSIS — Z170 Estrogen receptor positive status [ER+]: Secondary | ICD-10-CM | POA: Insufficient documentation

## 2020-10-20 DIAGNOSIS — C50411 Malignant neoplasm of upper-outer quadrant of right female breast: Secondary | ICD-10-CM

## 2020-10-20 MED ORDER — LETROZOLE 2.5 MG PO TABS: 2.5000 mg | ORAL_TABLET | Freq: Every day | ORAL | 3 refills | Status: DC

## 2020-10-20 NOTE — Assessment & Plan Note (Signed)
08/31/2020:Right lumpectomy Gina Walsh): IDC, grade 2, 1.8cm, clear margins, 2 lymph nodes negative for carcinoma.  ER 70%, PR 50%, Ki67 15%, HER2 negative Oncotype DX testing: Recurrence score: 20  Treatment plan: 1. Adjuvant radiation therapy completed 10/20/2020 3. Adjuvant antiestrogen therapy to start 10/30/2020  Anastrozole counseling: We discussed the risks and benefits of anti-estrogen therapy with aromatase inhibitors. These include but not limited to insomnia, hot flashes, mood changes, vaginal dryness, bone density loss, and weight gain. We strongly believe that the benefits far outweigh the risks. Patient understands these risks and consented to starting treatment. Planned treatment duration is 7 years.   Return to clinic in 3 months for survivorship care plan visit

## 2020-10-20 NOTE — Progress Notes (Signed)
Patient Care Team: Merrilee Seashore, MD as PCP - General (Internal Medicine)  DIAGNOSIS:    ICD-10-CM   1. Malignant neoplasm of upper-outer quadrant of right breast in female, estrogen receptor positive ([place])  C50.411    Z17.0       SUMMARY OF ONCOLOGIC HISTORY: Oncology History  Malignant neoplasm of upper-outer quadrant of right breast in female, estrogen receptor positive ([place])  1997 Pathology Results   History of left breast DCIS status postlumpectomy (Dr. Margot Chimes) and radiation, did not receive antiestrogen therapy   08/09/2020 Initial Diagnosis   Patient palpated a right breast mass which was 1.9 cm by mammogram at 11:30 position right breast, axilla negative, biopsy revealed grade 2-3 IDC ER 70%, PR 50%, Ki-67 15%, HER2 negative   08/24/2020 Cancer Staging   Staging form: Breast, AJCC 8th Edition - Clinical: Stage IA (cT1c, cN0, cM0, G3, ER+, PR+, HER2-, Oncotype DX score: 20) - Signed by Nicholas Lose, MD on 10/20/2020  Stage prefix: Initial diagnosis  Multigene prognostic tests performed: Oncotype DX  Recurrence score range: Greater than or equal to 11  Histologic grading system: 3 grade system    08/31/2020 Surgery   Right lumpectomy [name]): IDC, grade 2, 1.8cm, clear margins, 2 lymph nodes negative for carcinoma.   09/04/2020 Genetic Testing   Negative genetic testing on the CancerNext-Expanded+RNAinsight panel.  The CancerNext-Expanded gene panel offered by [facility] and includes sequencing and rearrangement analysis for the following 77 genes: AIP, ALK, APC*, ATM*, AXIN2, BAP1, BARD1, BLM, BMPR1A, BRCA1*, BRCA2*, BRIP1*, CDC73, CDH1*, CDK4, CDKN1B, CDKN2A, CHEK2*, CTNNA1, DICER1, FANCC, FH, FLCN, GALNT12, KIF1B, LZTR1, MAX, MEN1, MET, MLH1*, MSH2*, MSH3, MSH6*, MUTYH*, NBN, NF1*, NF2, NTHL1, PALB2*, PHOX2B, PMS2*, POT1, PRKAR1A, PTCH1, PTEN*, RAD51C*, RAD51D*, RB1, RECQL, RET, SDHA, SDHAF2, SDHB, SDHC, SDHD, SMAD4, SMARCA4, SMARCB1, SMARCE1, STK11, SUFU, TMEM127,  TP53*, TSC1, TSC2, VHL and XRCC2 (sequencing and deletion/duplication); EGFR, EGLN1, HOXB13, KIT, MITF, PDGFRA, POLD1, and POLE (sequencing only); EPCAM and GREM1 (deletion/duplication only). DNA and RNA analyses performed for * genes. The report date is September 04, 2020   09/26/2020 - 10/20/2020 Radiation Therapy   Adjuvant radiation     CHIEF COMPLIANT: Follow-up of right breast cancer  INTERVAL HISTORY: Gina Walsh is a 70 y.o. with above-mentioned history of right breast cancer having undergone a lumpectomy, currently on radiation therapy. She reports to the clinic today for follow-up.  Mild radiation dermatitis but otherwise doing quite well.  ALLERGIES:  is allergic to sulfa antibiotics.  MEDICATIONS:  Current Outpatient Medications  Medication Sig Dispense Refill   letrozole (FEMARA) 2.5 MG tablet Take 1 tablet (2.5 mg total) by mouth daily. 90 tablet 3   albuterol (VENTOLIN HFA) 108 (90 Base) MCG/ACT inhaler 2 puffs as needed     atorvastatin (LIPITOR) 40 MG tablet Take 40 mg by mouth daily.     Calcium Carbonate Antacid (TUMS E-X 750 PO) Take 2 tablets by mouth daily.     escitalopram (LEXAPRO) 20 MG tablet Take 20 mg by mouth daily.     traZODone (DESYREL) 50 MG tablet Take 50 mg by mouth at bedtime.     No current facility-administered medications for this visit.    PHYSICAL EXAMINATION: ECOG PERFORMANCE STATUS: 1 - Symptomatic but completely ambulatory  Vitals:   10/20/20 1352  BP: 125/75  Pulse: 79  Resp: 16  Temp: 97.6 F (36.4 C)  SpO2: 96%   Filed Weights   10/20/20 1352  Weight: 225 lb (102.1 kg)    BREAST:  No palpable masses or nodules in either right or left breasts. No palpable axillary supraclavicular or infraclavicular adenopathy no breast tenderness or nipple discharge. (exam performed in the presence of a chaperone)  LABORATORY DATA:  I have reviewed the data as listed No flowsheet data found.  No results found for: WBC, HGB, HCT, MCV, PLT,  NEUTROABS  ASSESSMENT & PLAN:  Malignant neoplasm of upper-outer quadrant of right breast in female, estrogen receptor positive ([place]) 08/31/2020:Right lumpectomy [name]): IDC, grade 2, 1.8cm, clear margins, 2 lymph nodes negative for carcinoma.  ER 70%, PR 50%, Ki67 15%, HER2 negative Oncotype DX testing: Recurrence score: 20  Treatment plan: 1. Adjuvant radiation therapy completed 10/20/2020 3. Adjuvant antiestrogen therapy to start 10/30/2020  Anastrozole counseling: We discussed the risks and benefits of anti-estrogen therapy with aromatase inhibitors. These include but not limited to insomnia, hot flashes, mood changes, vaginal dryness, bone density loss, and weight gain. We strongly believe that the benefits far outweigh the risks. Patient understands these risks and consented to starting treatment. Planned treatment duration is 7 years.    Return to clinic in 3 months for survivorship care plan visit    No orders of the defined types were placed in this encounter.  The patient has a good understanding of the overall plan. she agrees with it. she will call with any problems that may develop before the next visit here.  Total time spent: 20 mins including face to face time and time spent for planning, charting and coordination of care  Rulon Eisenmenger, MD, MPH 10/20/2020  I, Thana Ates, am acting as scribe for Dr. Nicholas Lose.  I have reviewed the above documentation for accuracy and completeness, and I agree with the above.

## 2020-10-23 ENCOUNTER — Ambulatory Visit: Payer: PPO

## 2020-10-24 ENCOUNTER — Ambulatory Visit: Payer: PPO

## 2020-10-25 ENCOUNTER — Ambulatory Visit: Payer: PPO

## 2020-10-25 NOTE — Progress Notes (Signed)
                                                                                                                                                             Patient Name: Gina Walsh MRN: [id number] DOB: [birth year] Referring Physician: Jovita Kussmaul (Profile Not Attached) Date of Service: 10/20/2020 [facility] [place], [place]                                                        End Of Treatment Note  Diagnoses: C50.411-Malignant neoplasm of upper-outer quadrant of right female breast  Cancer Staging: Stage IA, pT2N0M0 grade 2 ER/PR positive invasive ductal carcinoma of the right breast  Intent: Curative  Radiation Treatment Dates: 09/25/2020 through 10/20/2020 Site Technique Total Dose (Gy) Dose per Fx (Gy) Completed Fx Beam Energies  Breast, Right: Breast_Rt 3D 42.56/42.56 2.66 16/16 10X  Breast, Right: Breast_Rt_Bst 3D 8/8 2 4/4 6X, 10X   Narrative: The patient tolerated radiation therapy relatively well. She developed anticipated skin change in the treatment field but no fatigue.  Plan: The patient will receive a call in about one month from the radiation oncology department. She will continue follow up with Dr. Lindi Adie as well.   ________________________________________________    Carola Rhine, [facility]

## 2020-11-02 NOTE — Progress Notes (Signed)
  Radiation Oncology         [phone] ________________________________  Name: [name] MRN: [id number]  Date: 10/12/2020  DOB: May 09, 1950  SIMULATION NOTE   NARRATIVE:  The patient underwent simulation today for ongoing radiation therapy.  The existing CT study set was employed for the purpose of virtual treatment planning.  The target and avoidance structures were reviewed and modified as necessary.  Treatment planning then occurred.  The radiation boost prescription was entered and confirmed.  A total of 3 complex treatment devices were fabricated in the form of multi-leaf collimators to shape radiation around the targets while maximally excluding nearby normal structures. I have requested : Isodose Plan.    PLAN:  This modified radiation beam arrangement is intended to continue the current radiation dose to an additional 8 Gy in 4 fractions for a total cumulative dose of 50.56 Gy.    ------------------------------------------------  Jodelle Gross, MD, PhD

## 2020-11-16 ENCOUNTER — Ambulatory Visit: Payer: PPO

## 2020-11-27 ENCOUNTER — Ambulatory Visit
Admission: RE | Admit: 2020-11-27 | Discharge: 2020-11-27 | Disposition: A | Discharge status: Home / Self Care | Payer: PPO | Source: Ambulatory Visit | Attending: Radiation Oncology | Admitting: Radiation Oncology

## 2020-11-27 DIAGNOSIS — C50411 Malignant neoplasm of upper-outer quadrant of right female breast: Secondary | ICD-10-CM | POA: Insufficient documentation

## 2020-11-27 DIAGNOSIS — Z170 Estrogen receptor positive status [ER+]: Secondary | ICD-10-CM | POA: Insufficient documentation

## 2020-11-27 NOTE — Progress Notes (Signed)
  Radiation Oncology         [phone] ________________________________  Name: Gina Walsh MRN: [id number]  Date of Service: 11/27/2020  DOB: 03/21/1951  Post Treatment Telephone Note  Diagnosis:   Stage IA, pT2N0M0 grade 2 ER/PR positive invasive ductal carcinoma of the right breast  Interval Since Last Radiation:  6 weeks    09/25/2020 through 10/20/2020 Site Technique Total Dose (Gy) Dose per Fx (Gy) Completed Fx Beam Energies  Breast, Right: Breast_Rt 3D 42.56/42.56 2.66 16/16 10X  Breast, Right: Breast_Rt_Bst 3D 8/8 2 4/4 6X, 10X    Narrative:  The patient was contacted today for routine follow-up. During treatment she did very well with radiotherapy and did not have significant desquamation. She reports she is doing well and her skin changes have resolved.  Impression/Plan: 1. Stage IA, pT2N0M0 grade 2 ER/PR positive invasive ductal carcinoma of the right breast. The patient has been doing well since completion of radiotherapy. We discussed that we would be happy to continue to follow her as needed, but she will also continue to follow up with Dr. Lindi Adie in medical oncology. She was counseled on skin care as well as measures to avoid sun exposure to this area.  2. Survivorship. We discussed the importance of survivorship evaluation and encouraged her to attend her upcoming visit with that clinic.     Carola Rhine, PAC

## 2020-12-14 DIAGNOSIS — L814 Other melanin hyperpigmentation: Secondary | ICD-10-CM | POA: Diagnosis not present

## 2020-12-14 DIAGNOSIS — L570 Actinic keratosis: Secondary | ICD-10-CM | POA: Diagnosis not present

## 2020-12-14 DIAGNOSIS — D2372 Other benign neoplasm of skin of left lower limb, including hip: Secondary | ICD-10-CM | POA: Diagnosis not present

## 2020-12-14 DIAGNOSIS — L821 Other seborrheic keratosis: Secondary | ICD-10-CM | POA: Diagnosis not present

## 2020-12-14 DIAGNOSIS — Z808 Family history of malignant neoplasm of other organs or systems: Secondary | ICD-10-CM | POA: Diagnosis not present

## 2020-12-14 DIAGNOSIS — Z85828 Personal history of other malignant neoplasm of skin: Secondary | ICD-10-CM | POA: Diagnosis not present

## 2020-12-14 DIAGNOSIS — D225 Melanocytic nevi of trunk: Secondary | ICD-10-CM | POA: Diagnosis not present

## 2020-12-14 DIAGNOSIS — L578 Other skin changes due to chronic exposure to nonionizing radiation: Secondary | ICD-10-CM | POA: Diagnosis not present

## 2020-12-28 DIAGNOSIS — Z0000 Encounter for general adult medical examination without abnormal findings: Secondary | ICD-10-CM | POA: Diagnosis not present

## 2020-12-28 DIAGNOSIS — E559 Vitamin D deficiency, unspecified: Secondary | ICD-10-CM | POA: Diagnosis not present

## 2020-12-28 DIAGNOSIS — R7303 Prediabetes: Secondary | ICD-10-CM | POA: Diagnosis not present

## 2020-12-28 DIAGNOSIS — E782 Mixed hyperlipidemia: Secondary | ICD-10-CM | POA: Diagnosis not present

## 2020-12-28 DIAGNOSIS — Z780 Asymptomatic menopausal state: Secondary | ICD-10-CM | POA: Diagnosis not present

## 2020-12-28 DIAGNOSIS — R5383 Other fatigue: Secondary | ICD-10-CM | POA: Diagnosis not present

## 2021-01-04 DIAGNOSIS — Z0000 Encounter for general adult medical examination without abnormal findings: Secondary | ICD-10-CM | POA: Diagnosis not present

## 2021-01-04 DIAGNOSIS — C50411 Malignant neoplasm of upper-outer quadrant of right female breast: Secondary | ICD-10-CM | POA: Diagnosis not present

## 2021-01-04 DIAGNOSIS — E6609 Other obesity due to excess calories: Secondary | ICD-10-CM | POA: Diagnosis not present

## 2021-01-04 DIAGNOSIS — I479 Paroxysmal tachycardia, unspecified: Secondary | ICD-10-CM | POA: Diagnosis not present

## 2021-01-04 DIAGNOSIS — R030 Elevated blood-pressure reading, without diagnosis of hypertension: Secondary | ICD-10-CM | POA: Diagnosis not present

## 2021-01-04 DIAGNOSIS — E782 Mixed hyperlipidemia: Secondary | ICD-10-CM | POA: Diagnosis not present

## 2021-01-04 DIAGNOSIS — R7303 Prediabetes: Secondary | ICD-10-CM | POA: Diagnosis not present

## 2021-01-23 ENCOUNTER — Telehealth: Payer: Self-pay | Admitting: *Deleted

## 2021-01-25 ENCOUNTER — Encounter: Payer: Self-pay | Admitting: Adult Health

## 2021-01-25 ENCOUNTER — Other Ambulatory Visit: Payer: Self-pay

## 2021-01-25 ENCOUNTER — Inpatient Hospital Stay: Payer: PPO | Attending: Adult Health | Admitting: Adult Health

## 2021-01-25 VITALS — BP 135/80 | HR 82 | Temp 98.5°F | Resp 18 | Ht 68.0 in | Wt 224.0 lb

## 2021-01-25 DIAGNOSIS — Z170 Estrogen receptor positive status [ER+]: Secondary | ICD-10-CM | POA: Insufficient documentation

## 2021-01-25 DIAGNOSIS — Z923 Personal history of irradiation: Secondary | ICD-10-CM | POA: Diagnosis not present

## 2021-01-25 DIAGNOSIS — C50411 Malignant neoplasm of upper-outer quadrant of right female breast: Secondary | ICD-10-CM | POA: Insufficient documentation

## 2021-01-25 NOTE — Progress Notes (Signed)
SURVIVORSHIP VISIT:  BRIEF ONCOLOGIC HISTORY:  Oncology History  Malignant neoplasm of upper-outer quadrant of right breast in female, estrogen receptor positive ([place])  1997 Pathology Results   History of left breast DCIS status postlumpectomy (Dr. Margot Chimes) and radiation, did not receive antiestrogen therapy   08/09/2020 Initial Diagnosis   Patient palpated a right breast mass which was 1.9 cm by mammogram at 11:30 position right breast, axilla negative, biopsy revealed grade 2-3 IDC ER 70%, PR 50%, Ki-67 15%, HER2 negative   08/24/2020 Cancer Staging   Staging form: Breast, AJCC 8th Edition - Clinical: Stage IA (cT1c, cN0, cM0, G3, ER+, PR+, HER2-, Oncotype DX score: 20) - Signed by Nicholas Lose, MD on 10/20/2020 Stage prefix: Initial diagnosis Multigene prognostic tests performed: Oncotype DX Recurrence score range: Greater than or equal to 11 Histologic grading system: 3 grade system    08/31/2020 Surgery   Right lumpectomy [name]): IDC, grade 2, 1.8cm, clear margins, 2 lymph nodes negative for carcinoma.   08/31/2020 Cancer Staging   Staging form: Breast, AJCC 8th Edition - Pathologic stage from 08/31/2020: Stage IA (pT1c, pN0, cM0, G2, ER+, PR+, HER2-) - Signed by Gardenia Phlegm, NP on 01/24/2021 Stage prefix: Initial diagnosis Histologic grading system: 3 grade system    09/04/2020 Genetic Testing   Negative genetic testing on the CancerNext-Expanded+RNAinsight panel.  The CancerNext-Expanded gene panel offered by [facility] and includes sequencing and rearrangement analysis for the following 77 genes: AIP, ALK, APC*, ATM*, AXIN2, BAP1, BARD1, BLM, BMPR1A, BRCA1*, BRCA2*, BRIP1*, CDC73, CDH1*, CDK4, CDKN1B, CDKN2A, CHEK2*, CTNNA1, DICER1, FANCC, FH, FLCN, GALNT12, KIF1B, LZTR1, MAX, MEN1, MET, MLH1*, MSH2*, MSH3, MSH6*, MUTYH*, NBN, NF1*, NF2, NTHL1, PALB2*, PHOX2B, PMS2*, POT1, PRKAR1A, PTCH1, PTEN*, RAD51C*, RAD51D*, RB1, RECQL, RET, SDHA, SDHAF2, SDHB, SDHC, SDHD, SMAD4,  SMARCA4, SMARCB1, SMARCE1, STK11, SUFU, TMEM127, TP53*, TSC1, TSC2, VHL and XRCC2 (sequencing and deletion/duplication); EGFR, EGLN1, HOXB13, KIT, MITF, PDGFRA, POLD1, and POLE (sequencing only); EPCAM and GREM1 (deletion/duplication only). DNA and RNA analyses performed for * genes. The report date is September 04, 2020   09/26/2020 - 10/20/2020 Radiation Therapy   Adjuvant radiation   10/2020 -  Anti-estrogen oral therapy   Letrozole daily     INTERVAL HISTORY:  Gina Walsh to review her survivorship care plan detailing her treatment course for breast cancer, as well as monitoring long-term side effects of that treatment, education regarding health maintenance, screening, and overall wellness and health promotion.     Overall, Gina Walsh reports feeling quite well.  She is taking letrozole daily with good tolerance.  REVIEW OF SYSTEMS:  Review of Systems  Constitutional:  Negative for appetite change, chills, fatigue, fever and unexpected weight change.  HENT:   Negative for hearing loss, lump/mass and trouble swallowing.   Eyes:  Negative for eye problems and icterus.  Respiratory:  Negative for chest tightness, cough and shortness of breath.   Cardiovascular:  Negative for chest pain, leg swelling and palpitations.  Gastrointestinal:  Negative for abdominal distention, abdominal pain, constipation, diarrhea, nausea and vomiting.  Endocrine: Negative for hot flashes.  Genitourinary:  Negative for difficulty urinating.   Musculoskeletal:  Negative for arthralgias.  Skin:  Negative for itching and rash.  Neurological:  Negative for dizziness, extremity weakness, headaches and numbness.  Hematological:  Negative for adenopathy. Does not bruise/bleed easily.  Psychiatric/Behavioral:  Negative for depression. The patient is not nervous/anxious.   Breast: Denies any new nodularity, masses, tenderness, nipple changes, or nipple discharge.      ONCOLOGY  TREATMENT TEAM:  1. Surgeon:  Dr.  [name] at [facility] 2. Medical Oncologist: Dr. Lindi Adie  3. Radiation Oncologist: Dr. Lisbeth Renshaw    PAST MEDICAL/SURGICAL HISTORY:  Past Medical History:  Diagnosis Date   Anxiety    Breast cancer ([place])    Depression    Family history of breast cancer    Family history of prostate cancer    GERD (gastroesophageal reflux disease)    Hypercholesteremia    Personal history of radiation therapy 1999   Pre-diabetes    Past Surgical History:  Procedure Laterality Date   BREAST EXCISIONAL BIOPSY Bilateral 02/03/1998   malignant   BREAST LUMPECTOMY  1999   left lumpectomy for cancer   BREAST LUMPECTOMY WITH SENTINEL LYMPH NODE BIOPSY Right 08/31/2020   Procedure: RIGHT BREAST LUMPECTOMY WITH SENTINEL LYMPH NODE BX;  Surgeon: Jovita Kussmaul, MD;  Location: [place];  Service: General;  Laterality: Right;   COLONOSCOPY  2004     ALLERGIES:  Allergies  Allergen Reactions   Sulfa Antibiotics Hives     CURRENT MEDICATIONS:  Outpatient Encounter Medications as of 01/25/2021  Medication Sig   albuterol (VENTOLIN HFA) 108 (90 Base) MCG/ACT inhaler 2 puffs as needed   atorvastatin (LIPITOR) 40 MG tablet Take 40 mg by mouth daily.   Calcium Carbonate Antacid (TUMS E-X 750 PO) Take 2 tablets by mouth daily.   escitalopram (LEXAPRO) 20 MG tablet Take 20 mg by mouth daily.   letrozole (FEMARA) 2.5 MG tablet Take 1 tablet (2.5 mg total) by mouth daily.   traZODone (DESYREL) 50 MG tablet Take 50 mg by mouth at bedtime.   No facility-administered encounter medications on file as of 01/25/2021.     ONCOLOGIC FAMILY HISTORY:  Family History  Problem Relation Age of Onset   Breast cancer Sister 37   Prostate cancer Father 54       d. 18   Congestive Heart Failure Mother    Stroke Maternal Grandmother 16       d. 72   Lung disease Maternal Grandfather        ? [place] lung   Bowel Disease Paternal Grandmother    Breast cancer Cousin 25       pat first cousin's  daughter   Colon cancer Neg Hx    Esophageal cancer Neg Hx    Rectal cancer Neg Hx    Stomach cancer Neg Hx        SOCIAL HISTORY:  Social History   Socioeconomic History   Marital status: Married    Spouse name: Not on file   Number of children: Not on file   Years of education: Not on file   Highest education level: Not on file  Occupational History   Not on file  Tobacco Use   Smoking status: Never   Smokeless tobacco: Never  Substance and Sexual Activity   Alcohol use: Yes    Alcohol/week: 1.0 standard drink    Types: 1 Glasses of wine per week    Comment: occassion   Drug use: No   Sexual activity: Not on file  Other Topics Concern   Not on file  Social History Narrative   Not on file   Social Determinants of Health   Financial Resource Strain: Low Risk    Difficulty of Paying Living Expenses: Not hard at all  Food Insecurity: Not on file  Transportation Needs: No Transportation Needs   Lack of Transportation (Medical): No   Lack  of Transportation (Non-Medical): No  Physical Activity: Not on file  Stress: Not on file  Social Connections: Not on file  Intimate Partner Violence: Not At Risk   Fear of Current or Ex-Partner: No   Emotionally Abused: No   Physically Abused: No   Sexually Abused: No     OBSERVATIONS/OBJECTIVE:  BP 135/80 (BP Location: Left Arm, Patient Position: Sitting)   Pulse 82   Temp 98.5 F (36.9 C) (Tympanic)   Resp 18   Ht _0  (1.727 m)   Wt 224 lb (101.6 kg)   SpO2 97%   BMI 34.06 kg/m  GENERAL: Patient is a well appearing female in no acute distress HEENT:  Sclerae anicteric.  Oropharynx clear and moist. No ulcerations or evidence of oropharyngeal candidiasis. Neck is supple.  NODES:  No cervical, supraclavicular, or axillary lymphadenopathy palpated.  BREAST EXAM: right breast s/p lumpectomy and radiation no sign of local recurrence, left breast benign LUNGS:  Clear to auscultation bilaterally.  No wheezes or  rhonchi. HEART:  Regular rate and rhythm. No murmur appreciated. ABDOMEN:  Soft, nontender.  Positive, normoactive bowel sounds. No organomegaly palpated. MSK:  No focal spinal tenderness to palpation. Full range of motion bilaterally in the upper extremities. EXTREMITIES:  No peripheral edema.   SKIN:  Clear with no obvious rashes or skin changes. No nail dyscrasia. NEURO:  Nonfocal. Well oriented.  Appropriate affect.  LABORATORY DATA:  None for this visit.  DIAGNOSTIC IMAGING:  None for this visit.      ASSESSMENT AND PLAN:  Ms.. Walsh is a pleasant 70 y.o. female with Stage IA right breast invasive ductal carcinoma, ER+/PR+/HER2-, diagnosed in 07/2020, treated with lumpectomy, adjuvant radiation therapy, and anti-estrogen therapy with Letrozole beginning in 10/2020.  She presents to the Survivorship Clinic for our initial meeting and routine follow-up post-completion of treatment for breast cancer.    1. Stage IA right breast cancer:  Gina Walsh is continuing to recover from definitive treatment for breast cancer. She will follow-up with her medical oncologist, Dr. Lindi Adie in 6 months with history and physical exam per surveillance protocol.  She will continue her anti-estrogen therapy with Letrozole. Thus far, she is tolerating the Letrozole well, with minimal side effects. She was instructed to make Dr. Lindi Adie or myself aware if she begins to experience any worsening side effects of the medication and I could see her back in clinic to help manage those side effects, as needed. Her mammogram is due 07/2021; orders placed today. Today, a comprehensive survivorship care plan and treatment summary was reviewed with the patient today detailing her breast cancer diagnosis, treatment course, potential late/long-term effects of treatment, appropriate follow-up care with recommendations for the future, and patient education resources.  A copy of this summary, along with a letter will be sent to  the patient's primary care provider via mail/fax/In Basket message after today's visit.    #. Problem(s) at Visit______________  #. Bone health:  Given Gina Walsh's age/history of breast cancer and her current treatment regimen including anti-estrogen therapy with Letrozole, she is at risk for bone demineralization.  She was given education on specific activities to promote bone health.  #. Cancer screening:  Due to Gina Walsh's history and her age, she should receive screening for skin cancers, colon cancer, and gynecologic cancers.  The information and recommendations are listed on the patient's comprehensive care plan/treatment summary and were reviewed in detail with the patient.    #. Health maintenance and wellness promotion: Ms.  Walsh was encouraged to consume 5-7 servings of fruits and vegetables per day. We reviewed the "Nutrition Rainbow" handout, as well as the handout "Take Control of Your Health and Reduce Your Cancer Risk" from the [place].  She was also encouraged to engage in moderate to vigorous exercise for 30 minutes per day most days of the week. We discussed the [employer] fitness program, which is designed for cancer survivors to help them become more physically fit after cancer treatments.  She was instructed to limit her alcohol consumption and continue to abstain from tobacco use.     #. Support services/counseling: It is not uncommon for this period of the patient's cancer care trajectory to be one of many emotions and stressors.  We discussed how this can be increasingly difficult during the times of quarantine and social distancing due to the COVID-19 pandemic.   She was given information regarding our available services and encouraged to contact me with any questions or for help enrolling in any of our support group/programs.    Follow up instructions:    -Return to cancer center 6 months for f/u with Dr. Lindi Adie  -Mammogram due in  07/2021 -Follow up with surgery in one year -She is welcome to return back to the Survivorship Clinic at any time; no additional follow-up needed at this time.  -Consider referral back to survivorship as a long-term survivor for continued surveillance  The patient was provided an opportunity to ask questions and all were answered. The patient agreed with the plan and demonstrated an understanding of the instructions.   Total encounter time: 30 minutes in face to face time, chart review, lab review, care coordination and documentation of the encounter.   Wilber Bihari, NP 01/25/21 2:44 PM Medical Oncology and Hematology [facility] [place], [place] [address] Tel. [phone]    Fax. [phone]  *Total Encounter Time as defined by the Centers for Medicare and Medicaid Services includes, in addition to the face-to-face time of a patient visit (documented in the note above) non-face-to-face time: obtaining and reviewing outside history, ordering and reviewing medications, tests or procedures, care coordination (communications with other health care professionals or caregivers) and documentation in the medical record.

## 2021-02-14 ENCOUNTER — Encounter: Payer: Self-pay | Admitting: Hematology and Oncology

## 2021-03-20 DIAGNOSIS — H43813 Vitreous degeneration, bilateral: Secondary | ICD-10-CM | POA: Diagnosis not present

## 2021-03-20 DIAGNOSIS — H2513 Age-related nuclear cataract, bilateral: Secondary | ICD-10-CM | POA: Diagnosis not present

## 2021-03-20 DIAGNOSIS — H40013 Open angle with borderline findings, low risk, bilateral: Secondary | ICD-10-CM | POA: Diagnosis not present

## 2021-03-20 DIAGNOSIS — D3131 Benign neoplasm of right choroid: Secondary | ICD-10-CM | POA: Diagnosis not present

## 2021-04-10 DIAGNOSIS — Z170 Estrogen receptor positive status [ER+]: Secondary | ICD-10-CM | POA: Diagnosis not present

## 2021-04-10 DIAGNOSIS — C50411 Malignant neoplasm of upper-outer quadrant of right female breast: Secondary | ICD-10-CM | POA: Diagnosis not present

## 2021-07-12 NOTE — Progress Notes (Signed)
? ?Patient Care Team: ?Merrilee Seashore, MD as PCP - General (Internal Medicine) ?Nicholas Lose, MD as Consulting Physician (Hematology and Oncology) ?Kyung Rudd, MD as Consulting Physician (Radiation Oncology) ?Jovita Kussmaul, MD as Consulting Physician (General Surgery) ? ?DIAGNOSIS:  ?Encounter Diagnosis  ?Name Primary?  ? Malignant neoplasm of upper-outer quadrant of right breast in female, estrogen receptor positive ([place])   ? ? ?SUMMARY OF ONCOLOGIC HISTORY: ?Oncology History  ?Malignant neoplasm of upper-outer quadrant of right breast in female, estrogen receptor positive ([place])  ?1997 Pathology Results  ? History of left breast DCIS status postlumpectomy (Dr. Margot Chimes) and radiation, did not receive antiestrogen therapy ?  ?08/09/2020 Initial Diagnosis  ? Patient palpated a right breast mass which was 1.9 cm by mammogram at 11:30 position right breast, axilla negative, biopsy revealed grade 2-3 IDC ER 70%, PR 50%, Ki-67 15%, HER2 negative ?  ?08/24/2020 Cancer Staging  ? Staging form: Breast, AJCC 8th Edition ?- Clinical: Stage IA (cT1c, cN0, cM0, G3, ER+, PR+, HER2-, Oncotype DX score: 20) - Signed by Nicholas Lose, MD on 10/20/2020 ?Stage prefix: Initial diagnosis ?Multigene prognostic tests performed: Oncotype DX ?Recurrence score range: Greater than or equal to 11 ?Histologic grading system: 3 grade system ? ?  ?08/31/2020 Surgery  ? Right lumpectomy [name]): IDC, grade 2, 1.8cm, clear margins, 2 lymph nodes negative for carcinoma. ?  ?08/31/2020 Cancer Staging  ? Staging form: Breast, AJCC 8th Edition ?- Pathologic stage from 08/31/2020: Stage IA (pT1c, pN0, cM0, G2, ER+, PR+, HER2-) - Signed by Gardenia Phlegm, NP on 01/24/2021 ?Stage prefix: Initial diagnosis ?Histologic grading system: 3 grade system ? ?  ?08/31/2020 Oncotype testing  ? 20/6% ?  ?09/04/2020 Genetic Testing  ? Negative genetic testing on the CancerNext-Expanded+RNAinsight panel.  The CancerNext-Expanded gene panel offered by [facility] and includes sequencing and rearrangement analysis for the following 77 genes: AIP, ALK, APC*, ATM*, AXIN2, BAP1, BARD1, BLM, BMPR1A, BRCA1*, BRCA2*, BRIP1*, CDC73, CDH1*, CDK4, CDKN1B, CDKN2A, CHEK2*, CTNNA1, DICER1, FANCC, FH, FLCN, GALNT12, KIF1B, LZTR1, MAX, MEN1, MET, MLH1*, MSH2*, MSH3, MSH6*, MUTYH*, NBN, NF1*, NF2, NTHL1, PALB2*, PHOX2B, PMS2*, POT1, PRKAR1A, PTCH1, PTEN*, RAD51C*, RAD51D*, RB1, RECQL, RET, SDHA, SDHAF2, SDHB, SDHC, SDHD, SMAD4, SMARCA4, SMARCB1, SMARCE1, STK11, SUFU, TMEM127, TP53*, TSC1, TSC2, VHL and XRCC2 (sequencing and deletion/duplication); EGFR, EGLN1, HOXB13, KIT, MITF, PDGFRA, POLD1, and POLE (sequencing only); EPCAM and GREM1 (deletion/duplication only). DNA and RNA analyses performed for * genes. The report date is September 04, 2020 ?  ?09/26/2020 - 10/20/2020 Radiation Therapy  ? Adjuvant radiation ?  ?10/2020 -  Anti-estrogen oral therapy  ? Letrozole daily ?  ? ? ?CHIEF COMPLIANT: Follow-up antiestrogen therapy. ? ?INTERVAL HISTORY: Gina Walsh is a 71 y.o. with above-mentioned history of right breast cancer having undergone a lumpectomy. Currently on antiestrogen therapy. She presents to the clinic today for a follow-up. She denies hot flashes no numbness and tingling in fingers or toes. No fatigue. She state that she is tolerating the Letrozole.  ? ? ?ALLERGIES:  is allergic to sulfa antibiotics and sulfamethoxazole-trimethoprim. ? ?MEDICATIONS:  ?Current Outpatient Medications  ?Medication Sig Dispense Refill  ? albuterol (VENTOLIN HFA) 108 (90 Base) MCG/ACT inhaler 2 puffs as needed (Patient not taking: Reported on 01/25/2021)    ? atorvastatin (LIPITOR) 40 MG tablet Take 40 mg by mouth daily.    ? Calcium Carbonate Antacid (TUMS E-X 750 PO) Take 2 tablets by mouth daily.    ? Cholecalciferol 10 MCG (400 UNIT) CAPS Take by mouth.    ?  escitalopram (LEXAPRO) 20 MG tablet Take 20 mg by mouth daily.    ? letrozole (FEMARA) 2.5 MG tablet Take 1 tablet (2.5 mg total) by  mouth daily. 90 tablet 3  ? traZODone (DESYREL) 50 MG tablet Take 50 mg by mouth at bedtime.    ? ?No current facility-administered medications for this visit.  ? ? ?PHYSICAL EXAMINATION: ?ECOG PERFORMANCE STATUS: 1 - Symptomatic but completely ambulatory ? ?Vitals:  ? 07/26/21 1343  ?BP: (!) 148/96  ?Pulse: (!) 110  ?Resp: 18  ?Temp: (!) 97.3 ?F (36.3 ?C)  ?SpO2: 97%  ? ?Filed Weights  ? 07/26/21 1343  ?Weight: 224 lb (101.6 kg)  ? ? ?BREAST: No palpable masses or nodules in either right or left breasts. No palpable axillary supraclavicular or infraclavicular adenopathy no breast tenderness or nipple discharge. (exam performed in the presence of a chaperone) ? ?ASSESSMENT & PLAN:  ?Malignant neoplasm of upper-outer quadrant of right breast in female, estrogen receptor positive ([place]) ?08/31/2020:Right lumpectomy [name]): IDC, grade 2, 1.8cm, clear margins, 2 lymph nodes negative for carcinoma.  ER 70%, PR 50%, Ki67 15%, HER2 negative ?Oncotype DX testing: Recurrence score: 20 ?  ?Treatment plan: ?1. Adjuvant radiation therapy completed 10/20/2020 ?3. Adjuvant antiestrogen therapy to start 10/30/2020 ?  ?Letrozole toxicities: Denies any adverse effects to letrozole therapy. ? ?Breast cancer surveillance: ?1.  Breast exam 07/26/2021: Benign ?2. mammogram scheduled for 08/15/2021 ? ?Return to clinic in 1 year for follow-up ? ? ? ?No orders of the defined types were placed in this encounter. ? ?The patient has a good understanding of the overall plan. she agrees with it. she will call with any problems that may develop before the next visit here. ?Total time spent: 30 mins including face to face time and time spent for planning, charting and co-ordination of care ? ? Harriette Ohara, MD ?07/26/21 ?I Gardiner Coins am scribing for Dr. Lindi Adie ? ?I have reviewed the above documentation for accuracy and completeness, and I agree with the above. ?  ? ?  ?

## 2021-07-26 ENCOUNTER — Inpatient Hospital Stay: Payer: PPO | Attending: Hematology and Oncology | Admitting: Hematology and Oncology

## 2021-07-26 DIAGNOSIS — Z79811 Long term (current) use of aromatase inhibitors: Secondary | ICD-10-CM | POA: Insufficient documentation

## 2021-07-26 DIAGNOSIS — C50411 Malignant neoplasm of upper-outer quadrant of right female breast: Secondary | ICD-10-CM | POA: Insufficient documentation

## 2021-07-26 DIAGNOSIS — Z923 Personal history of irradiation: Secondary | ICD-10-CM | POA: Diagnosis not present

## 2021-07-26 DIAGNOSIS — Z170 Estrogen receptor positive status [ER+]: Secondary | ICD-10-CM | POA: Diagnosis not present

## 2021-07-26 NOTE — Assessment & Plan Note (Addendum)
08/31/2020:Right lumpectomy [name]): IDC, grade 2, 1.8cm, clear margins, 2 lymph nodes negative for carcinoma.??ER 70%, PR 50%, Ki67 15%, HER2 negative ?Oncotype DX testing: Recurrence score: 20 ?? ?Treatment plan: ?1.?Adjuvant radiation therapy completed 10/20/2020 ?3. Adjuvant antiestrogen therapy to start 10/30/2020 ?? ?Letrozole toxicities: Denies any adverse effects to letrozole therapy. ? ?Breast cancer surveillance: ?1.  Breast exam 07/26/2021: Benign ?2. mammogram scheduled for 08/15/2021 ? ?Return to clinic in 1 year for follow-up ?

## 2021-09-10 ENCOUNTER — Ambulatory Visit
Admission: RE | Admit: 2021-09-10 | Discharge: 2021-09-10 | Disposition: A | Discharge status: Home / Self Care | Payer: PPO | Source: Ambulatory Visit | Attending: Adult Health | Admitting: Adult Health

## 2021-09-10 DIAGNOSIS — C50411 Malignant neoplasm of upper-outer quadrant of right female breast: Secondary | ICD-10-CM

## 2021-09-10 DIAGNOSIS — Z853 Personal history of malignant neoplasm of breast: Secondary | ICD-10-CM | POA: Diagnosis not present

## 2021-09-10 DIAGNOSIS — R922 Inconclusive mammogram: Secondary | ICD-10-CM | POA: Diagnosis not present

## 2021-09-19 DIAGNOSIS — D3131 Benign neoplasm of right choroid: Secondary | ICD-10-CM | POA: Diagnosis not present

## 2021-09-19 DIAGNOSIS — H43813 Vitreous degeneration, bilateral: Secondary | ICD-10-CM | POA: Diagnosis not present

## 2021-09-19 DIAGNOSIS — H40013 Open angle with borderline findings, low risk, bilateral: Secondary | ICD-10-CM | POA: Diagnosis not present

## 2021-09-19 DIAGNOSIS — H2513 Age-related nuclear cataract, bilateral: Secondary | ICD-10-CM | POA: Diagnosis not present

## 2021-10-22 ENCOUNTER — Other Ambulatory Visit: Payer: Self-pay | Admitting: Hematology and Oncology

## 2021-11-07 DIAGNOSIS — C50411 Malignant neoplasm of upper-outer quadrant of right female breast: Secondary | ICD-10-CM | POA: Diagnosis not present

## 2021-11-07 DIAGNOSIS — Z170 Estrogen receptor positive status [ER+]: Secondary | ICD-10-CM | POA: Diagnosis not present

## 2021-12-19 DIAGNOSIS — Z808 Family history of malignant neoplasm of other organs or systems: Secondary | ICD-10-CM | POA: Diagnosis not present

## 2021-12-19 DIAGNOSIS — D225 Melanocytic nevi of trunk: Secondary | ICD-10-CM | POA: Diagnosis not present

## 2021-12-19 DIAGNOSIS — L570 Actinic keratosis: Secondary | ICD-10-CM | POA: Diagnosis not present

## 2021-12-19 DIAGNOSIS — D2372 Other benign neoplasm of skin of left lower limb, including hip: Secondary | ICD-10-CM | POA: Diagnosis not present

## 2021-12-19 DIAGNOSIS — L578 Other skin changes due to chronic exposure to nonionizing radiation: Secondary | ICD-10-CM | POA: Diagnosis not present

## 2021-12-19 DIAGNOSIS — L821 Other seborrheic keratosis: Secondary | ICD-10-CM | POA: Diagnosis not present

## 2021-12-19 DIAGNOSIS — Z85828 Personal history of other malignant neoplasm of skin: Secondary | ICD-10-CM | POA: Diagnosis not present

## 2021-12-19 DIAGNOSIS — L814 Other melanin hyperpigmentation: Secondary | ICD-10-CM | POA: Diagnosis not present

## 2022-01-10 DIAGNOSIS — I479 Paroxysmal tachycardia, unspecified: Secondary | ICD-10-CM | POA: Diagnosis not present

## 2022-01-10 DIAGNOSIS — Z0000 Encounter for general adult medical examination without abnormal findings: Secondary | ICD-10-CM | POA: Diagnosis not present

## 2022-01-10 DIAGNOSIS — E6609 Other obesity due to excess calories: Secondary | ICD-10-CM | POA: Diagnosis not present

## 2022-01-10 DIAGNOSIS — R7303 Prediabetes: Secondary | ICD-10-CM | POA: Diagnosis not present

## 2022-01-10 DIAGNOSIS — R5383 Other fatigue: Secondary | ICD-10-CM | POA: Diagnosis not present

## 2022-01-10 DIAGNOSIS — C50411 Malignant neoplasm of upper-outer quadrant of right female breast: Secondary | ICD-10-CM | POA: Diagnosis not present

## 2022-01-10 DIAGNOSIS — R030 Elevated blood-pressure reading, without diagnosis of hypertension: Secondary | ICD-10-CM | POA: Diagnosis not present

## 2022-01-17 DIAGNOSIS — E782 Mixed hyperlipidemia: Secondary | ICD-10-CM | POA: Diagnosis not present

## 2022-01-17 DIAGNOSIS — E6609 Other obesity due to excess calories: Secondary | ICD-10-CM | POA: Diagnosis not present

## 2022-01-17 DIAGNOSIS — G4700 Insomnia, unspecified: Secondary | ICD-10-CM | POA: Diagnosis not present

## 2022-01-17 DIAGNOSIS — C50411 Malignant neoplasm of upper-outer quadrant of right female breast: Secondary | ICD-10-CM | POA: Diagnosis not present

## 2022-01-17 DIAGNOSIS — Z23 Encounter for immunization: Secondary | ICD-10-CM | POA: Diagnosis not present

## 2022-01-17 DIAGNOSIS — R7303 Prediabetes: Secondary | ICD-10-CM | POA: Diagnosis not present

## 2022-01-17 DIAGNOSIS — I479 Paroxysmal tachycardia, unspecified: Secondary | ICD-10-CM | POA: Diagnosis not present

## 2022-01-17 DIAGNOSIS — Z0000 Encounter for general adult medical examination without abnormal findings: Secondary | ICD-10-CM | POA: Diagnosis not present

## 2022-01-17 DIAGNOSIS — R29818 Other symptoms and signs involving the nervous system: Secondary | ICD-10-CM | POA: Diagnosis not present

## 2022-03-18 DIAGNOSIS — H43813 Vitreous degeneration, bilateral: Secondary | ICD-10-CM | POA: Diagnosis not present

## 2022-03-18 DIAGNOSIS — D3131 Benign neoplasm of right choroid: Secondary | ICD-10-CM | POA: Diagnosis not present

## 2022-03-18 DIAGNOSIS — H2513 Age-related nuclear cataract, bilateral: Secondary | ICD-10-CM | POA: Diagnosis not present

## 2022-03-18 DIAGNOSIS — H40013 Open angle with borderline findings, low risk, bilateral: Secondary | ICD-10-CM | POA: Diagnosis not present

## 2022-04-08 DIAGNOSIS — M545 Low back pain, unspecified: Secondary | ICD-10-CM | POA: Diagnosis not present

## 2022-04-08 DIAGNOSIS — M654 Radial styloid tenosynovitis [de Quervain]: Secondary | ICD-10-CM | POA: Diagnosis not present

## 2022-04-15 DIAGNOSIS — M5136 Other intervertebral disc degeneration, lumbar region: Secondary | ICD-10-CM | POA: Diagnosis not present

## 2022-04-26 DIAGNOSIS — M5451 Vertebrogenic low back pain: Secondary | ICD-10-CM | POA: Diagnosis not present

## 2022-04-30 DIAGNOSIS — M5451 Vertebrogenic low back pain: Secondary | ICD-10-CM | POA: Diagnosis not present

## 2022-05-02 DIAGNOSIS — M5451 Vertebrogenic low back pain: Secondary | ICD-10-CM | POA: Diagnosis not present

## 2022-05-07 DIAGNOSIS — M5451 Vertebrogenic low back pain: Secondary | ICD-10-CM | POA: Diagnosis not present

## 2022-05-10 DIAGNOSIS — M5451 Vertebrogenic low back pain: Secondary | ICD-10-CM | POA: Diagnosis not present

## 2022-05-14 DIAGNOSIS — M5451 Vertebrogenic low back pain: Secondary | ICD-10-CM | POA: Diagnosis not present

## 2022-05-16 DIAGNOSIS — M5451 Vertebrogenic low back pain: Secondary | ICD-10-CM | POA: Diagnosis not present

## 2022-05-21 DIAGNOSIS — M5451 Vertebrogenic low back pain: Secondary | ICD-10-CM | POA: Diagnosis not present

## 2022-05-23 DIAGNOSIS — M5451 Vertebrogenic low back pain: Secondary | ICD-10-CM | POA: Diagnosis not present

## 2022-05-29 DIAGNOSIS — M5451 Vertebrogenic low back pain: Secondary | ICD-10-CM | POA: Diagnosis not present

## 2022-06-04 DIAGNOSIS — M5451 Vertebrogenic low back pain: Secondary | ICD-10-CM | POA: Diagnosis not present

## 2022-06-06 DIAGNOSIS — M5451 Vertebrogenic low back pain: Secondary | ICD-10-CM | POA: Diagnosis not present

## 2022-06-11 DIAGNOSIS — M5451 Vertebrogenic low back pain: Secondary | ICD-10-CM | POA: Diagnosis not present

## 2022-06-14 DIAGNOSIS — M5451 Vertebrogenic low back pain: Secondary | ICD-10-CM | POA: Diagnosis not present

## 2022-06-18 DIAGNOSIS — M5451 Vertebrogenic low back pain: Secondary | ICD-10-CM | POA: Diagnosis not present

## 2022-06-20 DIAGNOSIS — M5451 Vertebrogenic low back pain: Secondary | ICD-10-CM | POA: Diagnosis not present

## 2022-06-25 DIAGNOSIS — M5451 Vertebrogenic low back pain: Secondary | ICD-10-CM | POA: Diagnosis not present

## 2022-06-27 DIAGNOSIS — M5451 Vertebrogenic low back pain: Secondary | ICD-10-CM | POA: Diagnosis not present

## 2022-08-20 NOTE — Progress Notes (Signed)
Patient Care Team: Gina Fick, MD as PCP - General (Internal Medicine) Gina Croissant, MD as Consulting Physician (Hematology and Oncology) Gina Puffer, MD as Consulting Physician (Radiation Oncology) Gina Miner, MD as Consulting Physician (General Surgery)  DIAGNOSIS: No diagnosis found.  SUMMARY OF ONCOLOGIC HISTORY: Oncology History  Malignant neoplasm of upper-outer quadrant of right breast in female, estrogen receptor positive (HCC)  1997 Pathology Results   History of left breast DCIS status postlumpectomy (Dr. Jamey Walsh) and radiation, did not receive antiestrogen therapy   08/09/2020 Initial Diagnosis   Patient palpated a right breast mass which was 1.9 cm by mammogram at 11:30 position right breast, axilla negative, biopsy revealed grade 2-3 IDC ER 70%, PR 50%, Ki-67 15%, HER2 negative   08/24/2020 Cancer Staging   Staging form: Breast, AJCC 8th Edition - Clinical: Stage IA (cT1c, cN0, cM0, G3, ER+, PR+, HER2-, Oncotype DX score: 20) - Signed by Gina Croissant, MD on 10/20/2020 Stage prefix: Initial diagnosis Multigene prognostic tests performed: Oncotype DX Recurrence score range: Greater than or equal to 11 Histologic grading system: 3 grade system   08/31/2020 Surgery   Right lumpectomy Gina Walsh): IDC, grade 2, 1.8cm, clear margins, 2 lymph nodes negative for carcinoma.   08/31/2020 Cancer Staging   Staging form: Breast, AJCC 8th Edition - Pathologic stage from 08/31/2020: Stage IA (pT1c, pN0, cM0, G2, ER+, PR+, HER2-) - Signed by Gina Socks, NP on 01/24/2021 Stage prefix: Initial diagnosis Histologic grading system: 3 grade system   08/31/2020 Oncotype testing   20/6%   09/04/2020 Genetic Testing   Negative genetic testing on the CancerNext-Expanded+RNAinsight panel.  The CancerNext-Expanded gene panel offered by [facility] and includes sequencing and rearrangement analysis for the following 77 genes: AIP, ALK, APC*, ATM*, AXIN2, BAP1, BARD1, BLM,  BMPR1A, BRCA1*, BRCA2*, BRIP1*, CDC73, CDH1*, CDK4, CDKN1B, CDKN2A, CHEK2*, CTNNA1, DICER1, FANCC, FH, FLCN, GALNT12, KIF1B, LZTR1, MAX, MEN1, MET, MLH1*, MSH2*, MSH3, MSH6*, MUTYH*, NBN, NF1*, NF2, NTHL1, PALB2*, PHOX2B, PMS2*, POT1, PRKAR1A, PTCH1, PTEN*, RAD51C*, RAD51D*, RB1, RECQL, RET, SDHA, SDHAF2, SDHB, SDHC, SDHD, SMAD4, SMARCA4, SMARCB1, SMARCE1, STK11, SUFU, TMEM127, TP53*, TSC1, TSC2, VHL and XRCC2 (sequencing and deletion/duplication); EGFR, EGLN1, HOXB13, KIT, MITF, PDGFRA, POLD1, and POLE (sequencing only); EPCAM and GREM1 (deletion/duplication only). DNA and RNA analyses performed for * genes. The report date is September 04, 2020   09/26/2020 - 10/20/2020 Radiation Therapy   Adjuvant radiation   10/2020 -  Anti-estrogen oral therapy   Letrozole daily     CHIEF COMPLIANT: Follow-up letrozole  INTERVAL HISTORY: Gina Walsh is a 72 y.o. with above-mentioned history of right breast cancer having undergone a lumpectomy. Currently on antiestrogen therapy with letrozole. She presents to the clinic today for a follow-up.   ALLERGIES:  is allergic to sulfa antibiotics and sulfamethoxazole-trimethoprim.  MEDICATIONS:  Current Outpatient Medications  Medication Sig Dispense Refill   albuterol (VENTOLIN HFA) 108 (90 Base) MCG/ACT inhaler 2 puffs as needed (Patient not taking: Reported on 01/25/2021)     atorvastatin (LIPITOR) 40 MG tablet Take 40 mg by mouth daily.     Calcium Carbonate Antacid (TUMS E-X 750 PO) Take 2 tablets by mouth daily.     Cholecalciferol 10 MCG (400 UNIT) CAPS Take by mouth.     escitalopram (LEXAPRO) 20 MG tablet Take 20 mg by mouth daily.     letrozole (FEMARA) 2.5 MG tablet TAKE 1 TABLET BY MOUTH EVERY DAY 90 tablet 3   traZODone (DESYREL) 50 MG tablet Take 50 mg by mouth  at bedtime.     No current facility-administered medications for this visit.    PHYSICAL EXAMINATION: ECOG PERFORMANCE STATUS: {CHL ONC ECOG PS:[phone]}  There were no vitals filed  for this visit. There were no vitals filed for this visit.  BREAST:*** No palpable masses or nodules in either right or left breasts. No palpable axillary supraclavicular or infraclavicular adenopathy no breast tenderness or nipple discharge. (exam performed in the presence of a chaperone)  LABORATORY DATA:  I have reviewed the data as listed     No data to display          No results found for: "WBC", "HGB", "HCT", "MCV", "PLT", "NEUTROABS"  ASSESSMENT & PLAN:  No problem-specific Assessment & Plan notes found for this encounter.    No orders of the defined types were placed in this encounter.  The patient has a good understanding of the overall plan. she agrees with it. she will call with any problems that may develop before the next visit here. Total time spent: 30 mins including face to face time and time spent for planning, charting and co-ordination of care   Gina Walsh, CMA 08/20/22    I Gina Walsh am acting as a Neurosurgeon for [employer]  ***

## 2022-08-22 ENCOUNTER — Inpatient Hospital Stay: Payer: PPO | Attending: Hematology and Oncology | Admitting: Hematology and Oncology

## 2022-08-22 VITALS — BP 142/91 | HR 99 | Temp 98.5°F | Wt 235.9 lb

## 2022-08-22 DIAGNOSIS — Z923 Personal history of irradiation: Secondary | ICD-10-CM | POA: Insufficient documentation

## 2022-08-22 DIAGNOSIS — Z79811 Long term (current) use of aromatase inhibitors: Secondary | ICD-10-CM | POA: Insufficient documentation

## 2022-08-22 DIAGNOSIS — Z170 Estrogen receptor positive status [ER+]: Secondary | ICD-10-CM | POA: Diagnosis not present

## 2022-08-22 DIAGNOSIS — C50411 Malignant neoplasm of upper-outer quadrant of right female breast: Secondary | ICD-10-CM | POA: Diagnosis not present

## 2022-08-22 DIAGNOSIS — M549 Dorsalgia, unspecified: Secondary | ICD-10-CM | POA: Insufficient documentation

## 2022-08-22 DIAGNOSIS — Z79899 Other long term (current) drug therapy: Secondary | ICD-10-CM | POA: Insufficient documentation

## 2022-08-22 MED ORDER — ANASTROZOLE 1 MG PO TABS: 1.0000 mg | ORAL_TABLET | Freq: Every day | ORAL | 3 refills | Status: DC

## 2022-08-22 NOTE — Assessment & Plan Note (Addendum)
08/31/2020:Right lumpectomy Carolynne Edouard): IDC, grade 2, 1.8cm, clear margins, 2 lymph nodes negative for carcinoma.  ER 70%, PR 50%, Ki67 15%, HER2 negative Oncotype DX testing: Recurrence score: 20   Treatment plan: 1. Adjuvant radiation therapy completed 10/20/2020 2. Adjuvant antiestrogen therapy started 10/30/2020   Letrozole toxicities: Muscle aches and pains.  I discussed with her about switching her to anastrozole therapy.  I sent a new prescription today.   Breast cancer surveillance: 1.  Breast exam 08/22/2022: Benign 2. mammogram scheduled for 09/10/2021   Telephone visit in 3 months to assess tolerance to anastrozole.

## 2022-09-24 ENCOUNTER — Other Ambulatory Visit: Payer: Self-pay | Admitting: Adult Health

## 2022-09-24 DIAGNOSIS — H43813 Vitreous degeneration, bilateral: Secondary | ICD-10-CM | POA: Diagnosis not present

## 2022-09-24 DIAGNOSIS — H40013 Open angle with borderline findings, low risk, bilateral: Secondary | ICD-10-CM | POA: Diagnosis not present

## 2022-09-24 DIAGNOSIS — D3131 Benign neoplasm of right choroid: Secondary | ICD-10-CM | POA: Diagnosis not present

## 2022-09-24 DIAGNOSIS — H2513 Age-related nuclear cataract, bilateral: Secondary | ICD-10-CM | POA: Diagnosis not present

## 2022-09-24 DIAGNOSIS — Z9889 Other specified postprocedural states: Secondary | ICD-10-CM

## 2022-09-24 IMAGING — MG DIGITAL DIAGNOSTIC BILAT W/ TOMO W/ CAD
8 of 14 series · 8 of 40 positions shown · non-contrast
Comparison: Previous exam(s).

CLINICAL DATA: 71-year-old female status post malignant right
lumpectomy with radiation therapy in August 2020. History of remote
treated left breast cancer.

EXAM:
DIGITAL DIAGNOSTIC BILATERAL MAMMOGRAM WITH TOMOSYNTHESIS AND CAD
TECHNIQUE: Bilateral digital diagnostic mammography and breast tomosynthesis
was performed. The images were evaluated with computer-aided
detection.

[R MLO]
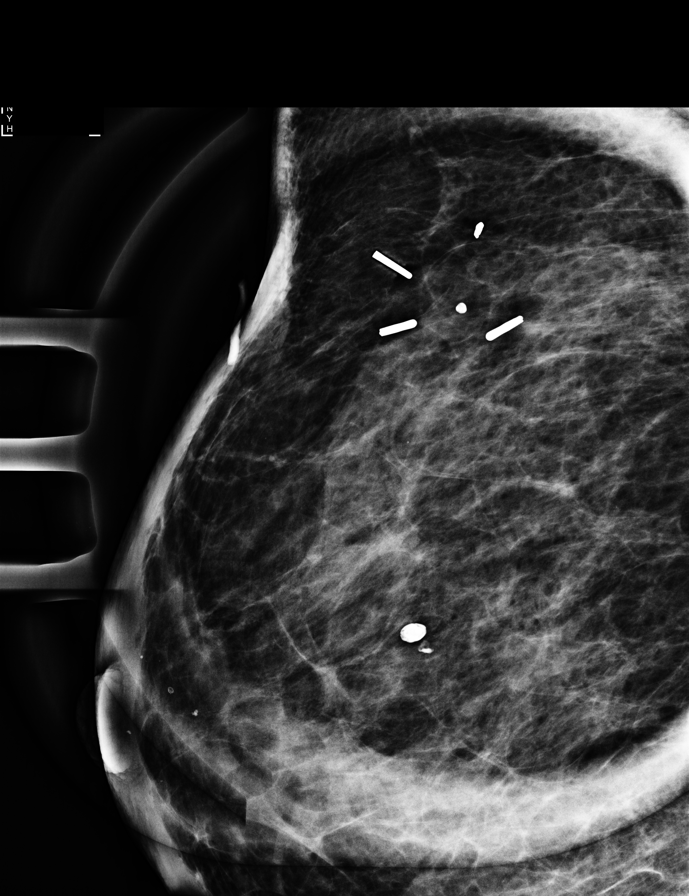

[R MLO synth-2D (1 of 2)]
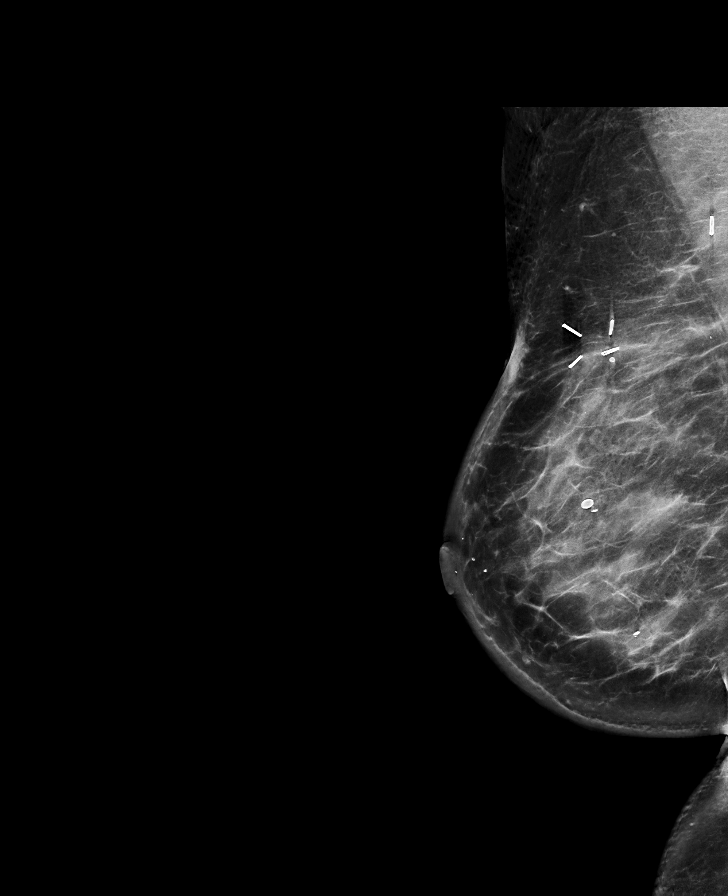

[R CC synth-2D]
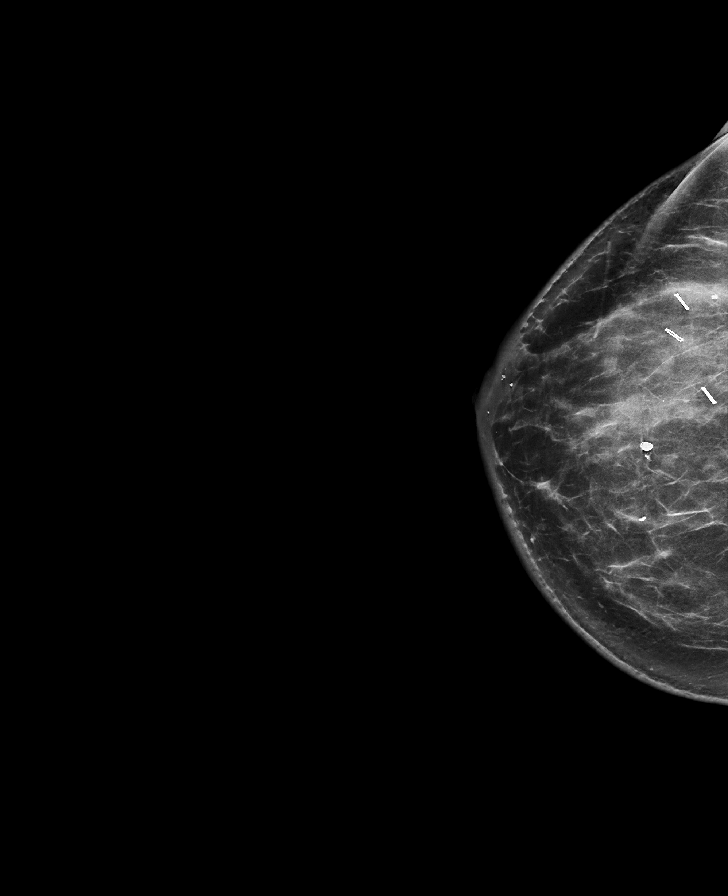

[R XCCL synth-2D]
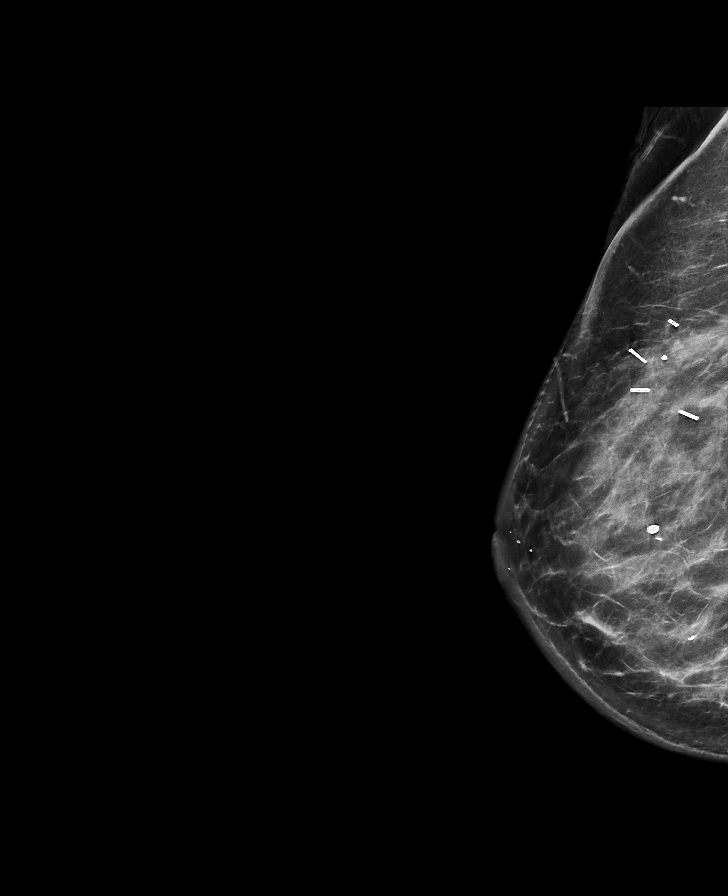

[R MLO synth-2D (2 of 2)]
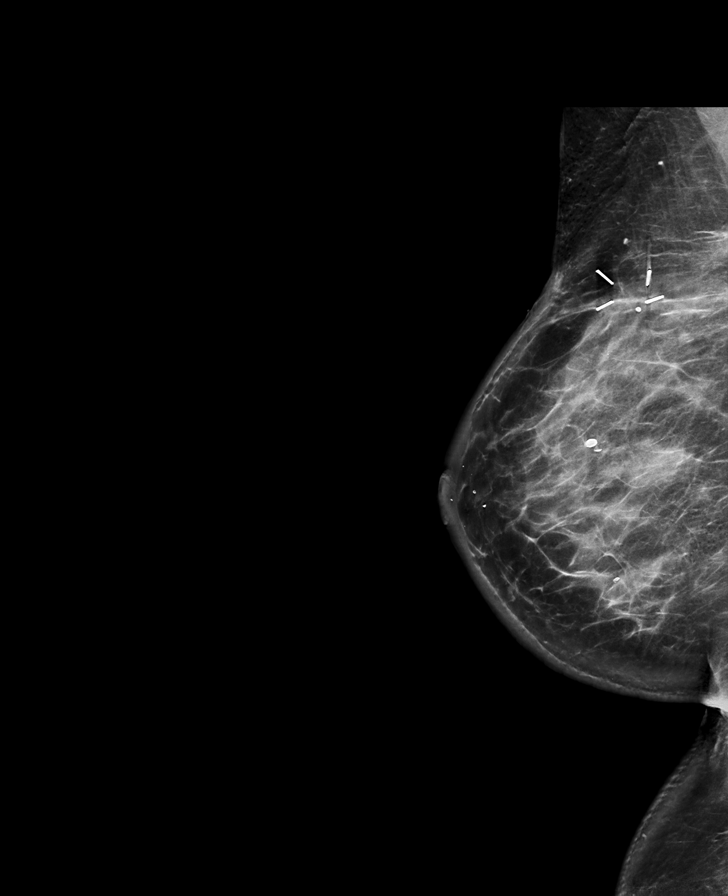

[L XCCL synth-2D]
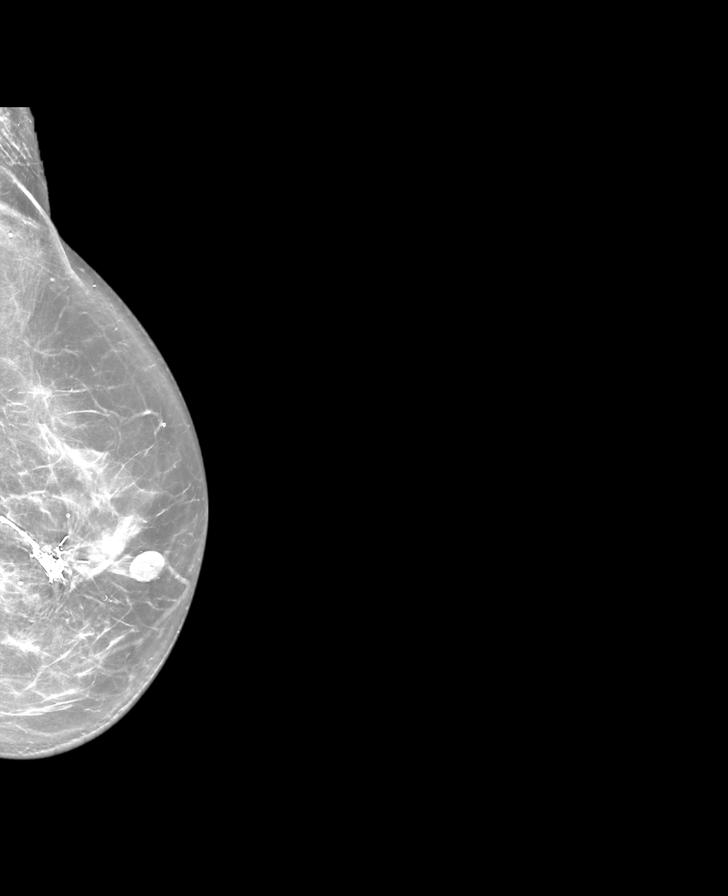

[L CC synth-2D]
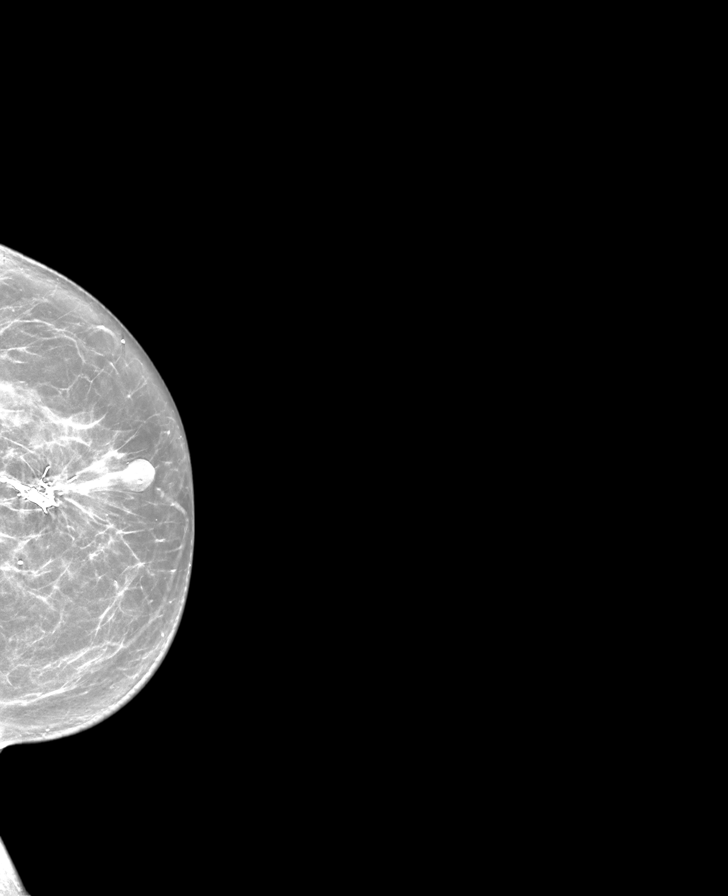

[R MLO tomo · tomo slice 47/93.0]
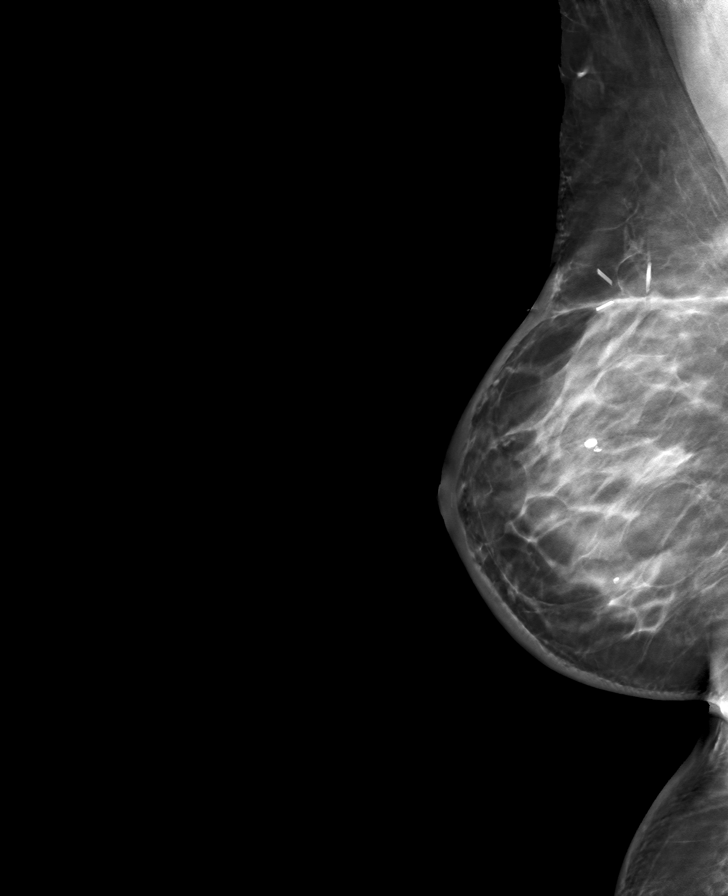

[8 of 40 positions shown; findings below may reference images not displayed]

ACR Breast Density Category c: The breast tissue is heterogeneously
dense, which may obscure small masses.
FINDINGS: Interval posttreatment changes within the right breast. Stable
posttreatment changes within the left breast. No new or suspicious
findings in either breast.
IMPRESSION: Bilateral posttreatment changes without mammographic evidence of
malignancy.

RECOMMENDATION:
Diagnostic mammogram is suggested in 1 year. (Code:[id number]-[id number])

I have discussed the findings and recommendations with the patient.
If applicable, a reminder letter will be sent to the patient
regarding the next appointment.

BI-RADS CATEGORY  2: Benign.

## 2022-10-02 ENCOUNTER — Other Ambulatory Visit: Payer: Self-pay | Admitting: Adult Health

## 2022-10-02 ENCOUNTER — Ambulatory Visit
Admission: RE | Admit: 2022-10-02 | Discharge: 2022-10-02 | Disposition: A | Discharge status: Home / Self Care | Payer: PPO | Source: Ambulatory Visit | Attending: Adult Health | Admitting: Adult Health

## 2022-10-02 DIAGNOSIS — N6459 Other signs and symptoms in breast: Secondary | ICD-10-CM | POA: Diagnosis not present

## 2022-10-02 DIAGNOSIS — Z853 Personal history of malignant neoplasm of breast: Secondary | ICD-10-CM | POA: Diagnosis not present

## 2022-10-02 DIAGNOSIS — Z9889 Other specified postprocedural states: Secondary | ICD-10-CM

## 2022-11-12 DIAGNOSIS — C50411 Malignant neoplasm of upper-outer quadrant of right female breast: Secondary | ICD-10-CM | POA: Diagnosis not present

## 2022-11-12 DIAGNOSIS — Z170 Estrogen receptor positive status [ER+]: Secondary | ICD-10-CM | POA: Diagnosis not present

## 2022-11-22 NOTE — Progress Notes (Signed)
HEMATOLOGY-ONCOLOGY TELEPHONE VISIT PROGRESS NOTE  I connected with our patient on 11/25/22 at  2:00 PM EDT by telephone and verified that I am speaking with the correct person using two identifiers.  I discussed the limitations, risks, security and privacy concerns of performing an evaluation and management service by telephone and the availability of in person appointments.  I also discussed with the patient that there may be a patient responsible charge related to this service. The patient expressed understanding and agreed to proceed.   History of Present Illness: Follow-up to discuss tolerance to anastrozole therapy.  She reports that she is tolerating anastrozole relatively similar to letrozole.  Back pain has been alleviated with the help of good massages.  She is able to tolerate anastrozole well and wants to stay on it.  Oncology History  Malignant neoplasm of upper-outer quadrant of right breast in female, estrogen receptor positive (HCC)  1997 Pathology Results   History of left breast DCIS status postlumpectomy (Dr. Jamey Ripa) and radiation, did not receive antiestrogen therapy   08/09/2020 Initial Diagnosis   Patient palpated a right breast mass which was 1.9 cm by mammogram at 11:30 position right breast, axilla negative, biopsy revealed grade 2-3 IDC ER 70%, PR 50%, Ki-67 15%, HER2 negative   08/24/2020 Cancer Staging   Staging form: Breast, AJCC 8th Edition - Clinical: Stage IA (cT1c, cN0, cM0, G3, ER+, PR+, HER2-, Oncotype DX score: 20) - Signed by Serena Croissant, MD on 10/20/2020 Stage prefix: Initial diagnosis Multigene prognostic tests performed: Oncotype DX Recurrence score range: Greater than or equal to 11 Histologic grading system: 3 grade system   08/31/2020 Surgery   Right lumpectomy Carolynne Edouard): IDC, grade 2, 1.8cm, clear margins, 2 lymph nodes negative for carcinoma.   08/31/2020 Cancer Staging   Staging form: Breast, AJCC 8th Edition - Pathologic stage from 08/31/2020: Stage  IA (pT1c, pN0, cM0, G2, ER+, PR+, HER2-) - Signed by Loa Socks, NP on 01/24/2021 Stage prefix: Initial diagnosis Histologic grading system: 3 grade system   08/31/2020 Oncotype testing   20/6%   09/04/2020 Genetic Testing   Negative genetic testing on the CancerNext-Expanded+RNAinsight panel.  The CancerNext-Expanded gene panel offered by [facility] and includes sequencing and rearrangement analysis for the following 77 genes: AIP, ALK, APC*, ATM*, AXIN2, BAP1, BARD1, BLM, BMPR1A, BRCA1*, BRCA2*, BRIP1*, CDC73, CDH1*, CDK4, CDKN1B, CDKN2A, CHEK2*, CTNNA1, DICER1, FANCC, FH, FLCN, GALNT12, KIF1B, LZTR1, MAX, MEN1, MET, MLH1*, MSH2*, MSH3, MSH6*, MUTYH*, NBN, NF1*, NF2, NTHL1, PALB2*, PHOX2B, PMS2*, POT1, PRKAR1A, PTCH1, PTEN*, RAD51C*, RAD51D*, RB1, RECQL, RET, SDHA, SDHAF2, SDHB, SDHC, SDHD, SMAD4, SMARCA4, SMARCB1, SMARCE1, STK11, SUFU, TMEM127, TP53*, TSC1, TSC2, VHL and XRCC2 (sequencing and deletion/duplication); EGFR, EGLN1, HOXB13, KIT, MITF, PDGFRA, POLD1, and POLE (sequencing only); EPCAM and GREM1 (deletion/duplication only). DNA and RNA analyses performed for * genes. The report date is September 04, 2020   09/26/2020 - 10/20/2020 Radiation Therapy   Adjuvant radiation   10/2020 -  Anti-estrogen oral therapy   Letrozole daily     REVIEW OF SYSTEMS:   Constitutional: Denies fevers, chills or abnormal weight loss All other systems were reviewed with the patient and are negative. Observations/Objective:     Assessment Plan:  Malignant neoplasm of upper-outer quadrant of right breast in female, estrogen receptor positive (HCC) 08/31/2020:Right lumpectomy Carolynne Edouard): IDC, grade 2, 1.8cm, clear margins, 2 lymph nodes negative for carcinoma.  ER 70%, PR 50%, Ki67 15%, HER2 negative Oncotype DX testing: Recurrence score: 20   Treatment plan: 1. Adjuvant radiation therapy  completed 10/20/2020 2. Adjuvant antiestrogen therapy started 10/30/2020 with letrozole then switched to anastrozole  on 08/22/2022   Anastrozole toxicities:  Back pains: better with massages No better and no worse than Letrozole  Breast cancer surveillance: 1.  Breast exam 08/22/2022: Benign 2. mammogram scheduled for 10/02/22: Benign Density Cat B   RTC in 1 year   I discussed the assessment and treatment plan with the patient. The patient was provided an opportunity to ask questions and all were answered. The patient agreed with the plan and demonstrated an understanding of the instructions. The patient was advised to call back or seek an in-person evaluation if the symptoms worsen or if the condition fails to improve as anticipated.   I provided 12 minutes of non-face-to-face time during this encounter.  This includes time for charting and coordination of care   Tamsen Meek, MD  I Janan Ridge am acting as a scribe for Dr.Vinay Gudena  I have reviewed the above documentation for accuracy and completeness, and I agree with the above.

## 2022-11-25 ENCOUNTER — Inpatient Hospital Stay: Payer: PPO | Attending: Hematology and Oncology | Admitting: Hematology and Oncology

## 2022-11-25 DIAGNOSIS — Z170 Estrogen receptor positive status [ER+]: Secondary | ICD-10-CM | POA: Diagnosis not present

## 2022-11-25 DIAGNOSIS — C50411 Malignant neoplasm of upper-outer quadrant of right female breast: Secondary | ICD-10-CM | POA: Diagnosis not present

## 2022-11-25 NOTE — Assessment & Plan Note (Signed)
08/31/2020:Right lumpectomy Gina Walsh): IDC, grade 2, 1.8cm, clear margins, 2 lymph nodes negative for carcinoma.  ER 70%, PR 50%, Ki67 15%, HER2 negative Oncotype DX testing: Recurrence score: 20   Treatment plan: 1. Adjuvant radiation therapy completed 10/20/2020 2. Adjuvant antiestrogen therapy started 10/30/2020 with letrozole then switched to anastrozole on 08/22/2022   Anastrozole toxicities:   Breast cancer surveillance: 1.  Breast exam 08/22/2022: Benign 2. mammogram scheduled for 09/10/2021   Telephone visit in 3 months to assess tolerance to anastrozole.

## 2022-11-27 ENCOUNTER — Telehealth: Payer: Self-pay | Admitting: Hematology and Oncology

## 2022-11-27 NOTE — Telephone Encounter (Signed)
Scheduled appointment per 8/26 los. Left voicemail with appointment details.

## 2022-12-11 DIAGNOSIS — L570 Actinic keratosis: Secondary | ICD-10-CM | POA: Diagnosis not present

## 2022-12-11 DIAGNOSIS — L301 Dyshidrosis [pompholyx]: Secondary | ICD-10-CM | POA: Diagnosis not present

## 2023-01-10 DIAGNOSIS — L570 Actinic keratosis: Secondary | ICD-10-CM | POA: Diagnosis not present

## 2023-01-10 DIAGNOSIS — L719 Rosacea, unspecified: Secondary | ICD-10-CM | POA: Diagnosis not present

## 2023-01-10 DIAGNOSIS — Z85828 Personal history of other malignant neoplasm of skin: Secondary | ICD-10-CM | POA: Diagnosis not present

## 2023-01-10 DIAGNOSIS — D225 Melanocytic nevi of trunk: Secondary | ICD-10-CM | POA: Diagnosis not present

## 2023-01-10 DIAGNOSIS — L821 Other seborrheic keratosis: Secondary | ICD-10-CM | POA: Diagnosis not present

## 2023-01-10 DIAGNOSIS — L814 Other melanin hyperpigmentation: Secondary | ICD-10-CM | POA: Diagnosis not present

## 2023-01-10 DIAGNOSIS — D485 Neoplasm of uncertain behavior of skin: Secondary | ICD-10-CM | POA: Diagnosis not present

## 2023-01-10 DIAGNOSIS — L578 Other skin changes due to chronic exposure to nonionizing radiation: Secondary | ICD-10-CM | POA: Diagnosis not present

## 2023-01-10 DIAGNOSIS — Z808 Family history of malignant neoplasm of other organs or systems: Secondary | ICD-10-CM | POA: Diagnosis not present

## 2023-01-23 DIAGNOSIS — Z0000 Encounter for general adult medical examination without abnormal findings: Secondary | ICD-10-CM | POA: Diagnosis not present

## 2023-01-23 DIAGNOSIS — C50411 Malignant neoplasm of upper-outer quadrant of right female breast: Secondary | ICD-10-CM | POA: Diagnosis not present

## 2023-01-23 DIAGNOSIS — R7303 Prediabetes: Secondary | ICD-10-CM | POA: Diagnosis not present

## 2023-01-23 DIAGNOSIS — R5383 Other fatigue: Secondary | ICD-10-CM | POA: Diagnosis not present

## 2023-01-23 DIAGNOSIS — E782 Mixed hyperlipidemia: Secondary | ICD-10-CM | POA: Diagnosis not present

## 2023-01-23 DIAGNOSIS — E6609 Other obesity due to excess calories: Secondary | ICD-10-CM | POA: Diagnosis not present

## 2023-01-27 DIAGNOSIS — M51362 Other intervertebral disc degeneration, lumbar region with discogenic back pain and lower extremity pain: Secondary | ICD-10-CM | POA: Diagnosis not present

## 2023-01-27 DIAGNOSIS — M5459 Other low back pain: Secondary | ICD-10-CM | POA: Diagnosis not present

## 2023-01-27 DIAGNOSIS — M48061 Spinal stenosis, lumbar region without neurogenic claudication: Secondary | ICD-10-CM | POA: Diagnosis not present

## 2023-01-30 DIAGNOSIS — E6609 Other obesity due to excess calories: Secondary | ICD-10-CM | POA: Diagnosis not present

## 2023-01-30 DIAGNOSIS — G4700 Insomnia, unspecified: Secondary | ICD-10-CM | POA: Diagnosis not present

## 2023-01-30 DIAGNOSIS — N182 Chronic kidney disease, stage 2 (mild): Secondary | ICD-10-CM | POA: Diagnosis not present

## 2023-01-30 DIAGNOSIS — I479 Paroxysmal tachycardia, unspecified: Secondary | ICD-10-CM | POA: Diagnosis not present

## 2023-01-30 DIAGNOSIS — E782 Mixed hyperlipidemia: Secondary | ICD-10-CM | POA: Diagnosis not present

## 2023-01-30 DIAGNOSIS — R7303 Prediabetes: Secondary | ICD-10-CM | POA: Diagnosis not present

## 2023-01-30 DIAGNOSIS — C50411 Malignant neoplasm of upper-outer quadrant of right female breast: Secondary | ICD-10-CM | POA: Diagnosis not present

## 2023-01-30 DIAGNOSIS — Z0000 Encounter for general adult medical examination without abnormal findings: Secondary | ICD-10-CM | POA: Diagnosis not present

## 2023-01-30 DIAGNOSIS — Z23 Encounter for immunization: Secondary | ICD-10-CM | POA: Diagnosis not present

## 2023-02-14 DIAGNOSIS — M5459 Other low back pain: Secondary | ICD-10-CM | POA: Diagnosis not present

## 2023-02-25 DIAGNOSIS — M47896 Other spondylosis, lumbar region: Secondary | ICD-10-CM | POA: Diagnosis not present

## 2023-03-13 DIAGNOSIS — M47896 Other spondylosis, lumbar region: Secondary | ICD-10-CM | POA: Diagnosis not present

## 2023-03-23 DIAGNOSIS — M5416 Radiculopathy, lumbar region: Secondary | ICD-10-CM | POA: Diagnosis not present

## 2023-04-15 DIAGNOSIS — M5416 Radiculopathy, lumbar region: Secondary | ICD-10-CM | POA: Diagnosis not present

## 2023-04-29 DIAGNOSIS — Z23 Encounter for immunization: Secondary | ICD-10-CM | POA: Diagnosis not present

## 2023-07-08 DIAGNOSIS — M5416 Radiculopathy, lumbar region: Secondary | ICD-10-CM | POA: Diagnosis not present

## 2023-08-12 ENCOUNTER — Other Ambulatory Visit: Payer: Self-pay | Admitting: Hematology and Oncology

## 2023-08-12 DIAGNOSIS — Z170 Estrogen receptor positive status [ER+]: Secondary | ICD-10-CM | POA: Diagnosis not present

## 2023-08-12 DIAGNOSIS — C50411 Malignant neoplasm of upper-outer quadrant of right female breast: Secondary | ICD-10-CM | POA: Diagnosis not present

## 2023-09-30 ENCOUNTER — Other Ambulatory Visit: Payer: Self-pay | Admitting: General Surgery

## 2023-09-30 DIAGNOSIS — Z853 Personal history of malignant neoplasm of breast: Secondary | ICD-10-CM

## 2023-10-07 DIAGNOSIS — H40013 Open angle with borderline findings, low risk, bilateral: Secondary | ICD-10-CM | POA: Diagnosis not present

## 2023-10-07 DIAGNOSIS — H2513 Age-related nuclear cataract, bilateral: Secondary | ICD-10-CM | POA: Diagnosis not present

## 2023-10-07 DIAGNOSIS — H43813 Vitreous degeneration, bilateral: Secondary | ICD-10-CM | POA: Diagnosis not present

## 2023-10-07 DIAGNOSIS — D3131 Benign neoplasm of right choroid: Secondary | ICD-10-CM | POA: Diagnosis not present

## 2023-11-04 ENCOUNTER — Ambulatory Visit
Admission: RE | Admit: 2023-11-04 | Discharge: 2023-11-04 | Disposition: A | Discharge status: Home / Self Care | Source: Ambulatory Visit | Attending: General Surgery | Admitting: General Surgery

## 2023-11-04 DIAGNOSIS — R928 Other abnormal and inconclusive findings on diagnostic imaging of breast: Secondary | ICD-10-CM | POA: Diagnosis not present

## 2023-11-04 DIAGNOSIS — Z853 Personal history of malignant neoplasm of breast: Secondary | ICD-10-CM

## 2023-11-06 ENCOUNTER — Encounter

## 2023-11-25 ENCOUNTER — Inpatient Hospital Stay: Payer: PPO | Attending: Hematology and Oncology | Admitting: Hematology and Oncology

## 2023-11-25 VITALS — BP 117/69 | HR 84 | Temp 97.6°F | Resp 16 | Wt 216.6 lb

## 2023-11-25 DIAGNOSIS — Z853 Personal history of malignant neoplasm of breast: Secondary | ICD-10-CM | POA: Insufficient documentation

## 2023-11-25 DIAGNOSIS — Z79811 Long term (current) use of aromatase inhibitors: Secondary | ICD-10-CM | POA: Insufficient documentation

## 2023-11-25 DIAGNOSIS — Z1721 Progesterone receptor positive status: Secondary | ICD-10-CM | POA: Insufficient documentation

## 2023-11-25 DIAGNOSIS — Z1732 Human epidermal growth factor receptor 2 negative status: Secondary | ICD-10-CM | POA: Diagnosis not present

## 2023-11-25 DIAGNOSIS — Z170 Estrogen receptor positive status [ER+]: Secondary | ICD-10-CM | POA: Diagnosis not present

## 2023-11-25 DIAGNOSIS — Z79899 Other long term (current) drug therapy: Secondary | ICD-10-CM | POA: Diagnosis not present

## 2023-11-25 DIAGNOSIS — C50411 Malignant neoplasm of upper-outer quadrant of right female breast: Secondary | ICD-10-CM | POA: Insufficient documentation

## 2023-11-25 DIAGNOSIS — M549 Dorsalgia, unspecified: Secondary | ICD-10-CM | POA: Insufficient documentation

## 2023-11-25 NOTE — Progress Notes (Signed)
 Patient Care Team: Verdia Lombard, MD as PCP - General (Internal Medicine) Odean Potts, MD as Consulting Physician (Hematology and Oncology) Dewey Rush, MD as Consulting Physician (Radiation Oncology) Curvin Deward MOULD, MD as Consulting Physician (General Surgery)  DIAGNOSIS:  Encounter Diagnosis  Name Primary?   Malignant neoplasm of upper-outer quadrant of right breast in female, estrogen receptor positive (HCC) Yes    SUMMARY OF ONCOLOGIC HISTORY: Oncology History  Malignant neoplasm of upper-outer quadrant of right breast in female, estrogen receptor positive (HCC)  1997 Pathology Results   History of left breast DCIS status postlumpectomy (Dr. Merrilyn) and radiation, did not receive antiestrogen therapy   08/09/2020 Initial Diagnosis   Patient palpated a right breast mass which was 1.9 cm by mammogram at 11:30 position right breast, axilla negative, biopsy revealed grade 2-3 IDC ER 70%, PR 50%, Ki-67 15%, HER2 negative   08/24/2020 Cancer Staging   Staging form: Breast, AJCC 8th Edition - Clinical: Stage IA (cT1c, cN0, cM0, G3, ER+, PR+, HER2-, Oncotype DX score: 20) - Signed by Odean Potts, MD on 10/20/2020 Stage prefix: Initial diagnosis Multigene prognostic tests performed: Oncotype DX Recurrence score range: Greater than or equal to 11 Histologic grading system: 3 grade system   08/31/2020 Surgery   Right lumpectomy Osker): IDC, grade 2, 1.8cm, clear margins, 2 lymph nodes negative for carcinoma.   08/31/2020 Cancer Staging   Staging form: Breast, AJCC 8th Edition - Pathologic stage from 08/31/2020: Stage IA (pT1c, pN0, cM0, G2, ER+, PR+, HER2-) - Signed by Crawford Morna Pickle, NP on 01/24/2021 Stage prefix: Initial diagnosis Histologic grading system: 3 grade system   08/31/2020 Oncotype testing   20/6%   09/04/2020 Genetic Testing   Negative genetic testing on the CancerNext-Expanded+RNAinsight panel.  The CancerNext-Expanded gene panel offered by [facility]  and includes sequencing and rearrangement analysis for the following 77 genes: AIP, ALK, APC*, ATM*, AXIN2, BAP1, BARD1, BLM, BMPR1A, BRCA1*, BRCA2*, BRIP1*, CDC73, CDH1*, CDK4, CDKN1B, CDKN2A, CHEK2*, CTNNA1, DICER1, FANCC, FH, FLCN, GALNT12, KIF1B, LZTR1, MAX, MEN1, MET, MLH1*, MSH2*, MSH3, MSH6*, MUTYH*, NBN, NF1*, NF2, NTHL1, PALB2*, PHOX2B, PMS2*, POT1, PRKAR1A, PTCH1, PTEN*, RAD51C*, RAD51D*, RB1, RECQL, RET, SDHA, SDHAF2, SDHB, SDHC, SDHD, SMAD4, SMARCA4, SMARCB1, SMARCE1, STK11, SUFU, TMEM127, TP53*, TSC1, TSC2, VHL and XRCC2 (sequencing and deletion/duplication); EGFR, EGLN1, HOXB13, KIT, MITF, PDGFRA, POLD1, and POLE (sequencing only); EPCAM and GREM1 (deletion/duplication only). DNA and RNA analyses performed for * genes. The report date is September 04, 2020   09/26/2020 - 10/20/2020 Radiation Therapy   Adjuvant radiation   10/2020 -  Anti-estrogen oral therapy   Letrozole  daily     CHIEF COMPLIANT:   HISTORY OF PRESENT ILLNESS:  History of Present Illness Gina Walsh is a 73 year old female with breast cancer on anastrozole  therapy who presents for routine follow-up.  She continues anastrozole  therapy without significant side effects and has a year's worth of refills. Her recent mammogram was normal. Back pain was evaluated and attributed to arthritis, with no signs of cancer on MRI. She is due for a bone density test at [facility], with previous tests being satisfactory.     ALLERGIES:  is allergic to sulfa antibiotics and sulfamethoxazole-trimethoprim.  MEDICATIONS:  Current Outpatient Medications  Medication Sig Dispense Refill   albuterol (VENTOLIN HFA) 108 (90 Base) MCG/ACT inhaler 2 puffs as needed (Patient not taking: Reported on 01/25/2021)     anastrozole  (ARIMIDEX ) 1 MG tablet TAKE 1 TABLET BY MOUTH EVERY DAY 90 tablet 3   atorvastatin (  LIPITOR) 40 MG tablet Take 40 mg by mouth daily.     Calcium Carbonate Antacid (TUMS E-X 750 PO) Take 2 tablets by mouth  daily.     Cholecalciferol 10 MCG (400 UNIT) CAPS Take by mouth.     escitalopram (LEXAPRO) 20 MG tablet Take 20 mg by mouth daily.     traZODone (DESYREL) 50 MG tablet Take 50 mg by mouth at bedtime.     No current facility-administered medications for this visit.    PHYSICAL EXAMINATION: ECOG PERFORMANCE STATUS: 1 - Symptomatic but completely ambulatory  Vitals:   11/25/23 1351  BP: 117/69  Pulse: 84  Resp: 16  Temp: 97.6 F (36.4 C)  SpO2: 93%   Filed Weights   11/25/23 1351  Weight: 216 lb 9.6 oz (98.2 kg)        ASSESSMENT & PLAN:  Malignant neoplasm of upper-outer quadrant of right breast in female, estrogen receptor positive (HCC) 08/31/2020:Right lumpectomy Osker): IDC, grade 2, 1.8cm, clear margins, 2 lymph nodes negative for carcinoma.  ER 70%, PR 50%, Ki67 15%, HER2 negative Oncotype DX testing: Recurrence score: 20   Treatment plan: 1. Adjuvant radiation therapy completed 10/20/2020 2. Adjuvant antiestrogen therapy started 10/30/2020 with letrozole  then switched to anastrozole  on 08/22/2022   Anastrozole  toxicities:  Back pains: better with massages No better and no worse than Letrozole    Breast cancer surveillance: mammogram 11/04/2023: Benign Density Cat B   RTC in 1 year (November 2026)     No orders of the defined types were placed in this encounter.  The patient has a good understanding of the overall plan. she agrees with it. she will call with any problems that may develop before the next visit here. Total time spent: 30 mins including face to face time and time spent for planning, charting and co-ordination of care   Naomi MARLA Chad, MD 11/25/23

## 2023-11-25 NOTE — Assessment & Plan Note (Signed)
 08/31/2020:Right lumpectomy Osker): IDC, grade 2, 1.8cm, clear margins, 2 lymph nodes negative for carcinoma.  ER 70%, PR 50%, Ki67 15%, HER2 negative Oncotype DX testing: Recurrence score: 20   Treatment plan: 1. Adjuvant radiation therapy completed 10/20/2020 2. Adjuvant antiestrogen therapy started 10/30/2020 with letrozole  then switched to anastrozole  on 08/22/2022   Anastrozole  toxicities:  Back pains: better with massages No better and no worse than Letrozole    Breast cancer surveillance: 1.  Breast exam 11/25/2023: Benign 2. mammogram 11/04/2023: Benign Density Cat B   RTC in 1 year

## 2024-02-05 DIAGNOSIS — E782 Mixed hyperlipidemia: Secondary | ICD-10-CM | POA: Diagnosis not present

## 2024-02-05 DIAGNOSIS — C50411 Malignant neoplasm of upper-outer quadrant of right female breast: Secondary | ICD-10-CM | POA: Diagnosis not present

## 2024-02-05 DIAGNOSIS — R5383 Other fatigue: Secondary | ICD-10-CM | POA: Diagnosis not present

## 2024-02-05 DIAGNOSIS — I479 Paroxysmal tachycardia, unspecified: Secondary | ICD-10-CM | POA: Diagnosis not present

## 2024-02-05 DIAGNOSIS — R7303 Prediabetes: Secondary | ICD-10-CM | POA: Diagnosis not present

## 2024-02-05 DIAGNOSIS — G4700 Insomnia, unspecified: Secondary | ICD-10-CM | POA: Diagnosis not present

## 2024-02-05 DIAGNOSIS — E6609 Other obesity due to excess calories: Secondary | ICD-10-CM | POA: Diagnosis not present

## 2024-02-05 DIAGNOSIS — N182 Chronic kidney disease, stage 2 (mild): Secondary | ICD-10-CM | POA: Diagnosis not present

## 2024-02-12 DIAGNOSIS — Z0000 Encounter for general adult medical examination without abnormal findings: Secondary | ICD-10-CM | POA: Diagnosis not present

## 2024-02-12 DIAGNOSIS — E782 Mixed hyperlipidemia: Secondary | ICD-10-CM | POA: Diagnosis not present

## 2024-02-12 DIAGNOSIS — R7303 Prediabetes: Secondary | ICD-10-CM | POA: Diagnosis not present

## 2024-02-12 DIAGNOSIS — Z23 Encounter for immunization: Secondary | ICD-10-CM | POA: Diagnosis not present

## 2024-02-12 DIAGNOSIS — G4700 Insomnia, unspecified: Secondary | ICD-10-CM | POA: Diagnosis not present

## 2024-02-12 DIAGNOSIS — E6609 Other obesity due to excess calories: Secondary | ICD-10-CM | POA: Diagnosis not present

## 2024-02-12 DIAGNOSIS — I479 Paroxysmal tachycardia, unspecified: Secondary | ICD-10-CM | POA: Diagnosis not present

## 2024-02-12 DIAGNOSIS — Z780 Asymptomatic menopausal state: Secondary | ICD-10-CM | POA: Diagnosis not present

## 2024-02-12 DIAGNOSIS — N182 Chronic kidney disease, stage 2 (mild): Secondary | ICD-10-CM | POA: Diagnosis not present

## 2024-02-12 DIAGNOSIS — C50411 Malignant neoplasm of upper-outer quadrant of right female breast: Secondary | ICD-10-CM | POA: Diagnosis not present

## 2024-03-17 ENCOUNTER — Encounter: Payer: Self-pay | Admitting: Internal Medicine

## 2025-02-03 ENCOUNTER — Ambulatory Visit: Admitting: Hematology and Oncology
